# Patient Record
Sex: Female | Born: 2000 | Race: Black or African American | Hispanic: Yes | Marital: Single | Smoking: Never smoker
Health system: Southern US, Community
[De-identification: ages and names within clinical notes are randomized; demographics above are authoritative.]

## PROBLEM LIST (undated history)

## (undated) DIAGNOSIS — K429 Umbilical hernia without obstruction or gangrene: Secondary | ICD-10-CM

## (undated) DIAGNOSIS — Z789 Other specified health status: Secondary | ICD-10-CM

## (undated) HISTORY — PX: ADENOIDECTOMY: SUR15

---
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
## 2020-05-21 ENCOUNTER — Emergency Department: Payer: PRIVATE HEALTH INSURANCE

## 2020-05-21 ENCOUNTER — Other Ambulatory Visit: Payer: Self-pay

## 2020-05-21 ENCOUNTER — Emergency Department
Admission: EM | Admit: 2020-05-21 | Discharge: 2020-05-21 | Disposition: A | Discharge status: Home / Self Care | Payer: PRIVATE HEALTH INSURANCE | Attending: Emergency Medicine | Admitting: Emergency Medicine

## 2020-05-21 ENCOUNTER — Encounter: Payer: Self-pay | Admitting: Radiology

## 2020-05-21 DIAGNOSIS — R519 Headache, unspecified: Secondary | ICD-10-CM | POA: Diagnosis not present

## 2020-05-21 DIAGNOSIS — Y92410 Unspecified street and highway as the place of occurrence of the external cause: Secondary | ICD-10-CM | POA: Diagnosis not present

## 2020-05-21 DIAGNOSIS — R079 Chest pain, unspecified: Secondary | ICD-10-CM | POA: Insufficient documentation
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
## 2020-06-11 ENCOUNTER — Other Ambulatory Visit: Payer: Self-pay

## 2020-06-11 ENCOUNTER — Other Ambulatory Visit (HOSPITAL_COMMUNITY)
Admission: RE | Admit: 2020-06-11 | Discharge: 2020-06-11 | Disposition: A | Discharge status: Home / Self Care | Payer: PRIVATE HEALTH INSURANCE | Source: Ambulatory Visit | Attending: Certified Nurse Midwife | Admitting: Certified Nurse Midwife

## 2020-06-11 ENCOUNTER — Ambulatory Visit (INDEPENDENT_AMBULATORY_CARE_PROVIDER_SITE_OTHER): Payer: No Typology Code available for payment source | Admitting: Certified Nurse Midwife

## 2020-06-11 ENCOUNTER — Encounter: Payer: Self-pay | Admitting: Certified Nurse Midwife

## 2020-06-11 VITALS — BP 104/72 | HR 86 | Ht 63.0 in | Wt 211.1 lb

## 2020-06-11 DIAGNOSIS — Z113 Encounter for screening for infections with a predominantly sexual mode of transmission: Secondary | ICD-10-CM

## 2020-06-11 DIAGNOSIS — R350 Frequency of micturition: Secondary | ICD-10-CM

## 2020-06-11 DIAGNOSIS — R300 Dysuria: Secondary | ICD-10-CM

## 2020-06-11 NOTE — Patient Instructions (Signed)
Urinary Tract Infection, Adult A urinary tract infection (UTI) is an infection of any part of the urinary tract. The urinary tract includes:  The kidneys.  The ureters.  The bladder.  The urethra. These organs make, store, and get rid of pee (urine) in the body. What are the causes? This is caused by germs (bacteria) in your genital area. These germs grow and cause swelling (inflammation) of your urinary tract. What increases the risk? You are more likely to develop this condition if:  You have a small, thin tube (catheter) to drain pee.  You cannot control when you pee or poop (incontinence).  You are female, and: ? You use these methods to prevent pregnancy:  A medicine that kills sperm (spermicide).  A device that blocks sperm (diaphragm). ? You have low levels of a female hormone (estrogen). ? You are pregnant.  You have genes that add to your risk.  You are sexually active.  You take antibiotic medicines.  You have trouble peeing because of: ? A prostate that is bigger than normal, if you are female. ? A blockage in the part of your body that drains pee from the bladder (urethra). ? A kidney stone. ? A nerve condition that affects your bladder (neurogenic bladder). ? Not getting enough to drink. ? Not peeing often enough.  You have other conditions, such as: ? Diabetes. ? A weak disease-fighting system (immune system). ? Sickle cell disease. ? Gout. ? Injury of the spine. What are the signs or symptoms? Symptoms of this condition include:  Needing to pee right away (urgently).  Peeing often.  Peeing small amounts often.  Pain or burning when peeing.  Blood in the pee.  Pee that smells bad or not like normal.  Trouble peeing.  Pee that is cloudy.  Fluid coming from the vagina, if you are female.  Pain in the belly or lower back. Other symptoms include:  Throwing up (vomiting).  No urge to eat.  Feeling mixed up (confused).  Being tired  and grouchy (irritable).  A fever.  Watery poop (diarrhea). How is this treated? This condition may be treated with:  Antibiotic medicine.  Other medicines.  Drinking enough water. Follow these instructions at home:  Medicines  Take over-the-counter and prescription medicines only as told by your doctor.  If you were prescribed an antibiotic medicine, take it as told by your doctor. Do not stop taking it even if you start to feel better. General instructions  Make sure you: ? Pee until your bladder is empty. ? Do not hold pee for a long time. ? Empty your bladder after sex. ? Wipe from front to back after pooping if you are a female. Use each tissue one time when you wipe.  Drink enough fluid to keep your pee pale yellow.  Keep all follow-up visits as told by your doctor. This is important. Contact a doctor if:  You do not get better after 1-2 days.  Your symptoms go away and then come back. Get help right away if:  You have very bad back pain.  You have very bad pain in your lower belly.  You have a fever.  You are sick to your stomach (nauseous).  You are throwing up. Summary  A urinary tract infection (UTI) is an infection of any part of the urinary tract.  This condition is caused by germs in your genital area.  There are many risk factors for a UTI. These include having a small, thin   tube to drain pee and not being able to control when you pee or poop.  Treatment includes antibiotic medicines for germs.  Drink enough fluid to keep your pee pale yellow. This information is not intended to replace advice given to you by your health care provider. Make sure you discuss any questions you have with your health care provider. Document Revised: 06/24/2018 Document Reviewed: 01/14/2018 Elsevier Patient Education  2020 Elsevier Inc.  

## 2020-06-11 NOTE — Progress Notes (Signed)
GYN ENCOUNTER NOTE  Subjective:       Jessica Keith is a 19 y.o. G0P0000 female is here for gynecologic evaluation of the following issues:  1. Screening for STD, she is currently sexually active and sometimes uses condoms 2. A week or so ago she has some UTI symptoms, frequency and burning that have started to improve with hydration and home remedies. She would like to be screened today for STD/vaginal infections.    Gynecologic History No LMP recorded. Patient has had an implant. Contraception: Nexplanon Last Pap: n/a .  Last mammogram: n/a   Obstetric History OB History  Gravida Para Term Preterm AB Living  0 0 0 0 0 0  SAB TAB Ectopic Multiple Live Births  0 0 0 0 0    No past medical history on file.  Past Surgical History:  Procedure Laterality Date  . ADENOIDECTOMY      Current Outpatient Medications on File Prior to Visit  Medication Sig Dispense Refill  . Etonogestrel (NEXPLANON [place]) Inject into the skin.     No current facility-administered medications on file prior to visit.    No Known Allergies  Social History   Socioeconomic History  . Marital status: Single    Spouse name: Not on file  . Number of children: Not on file  . Years of education: Not on file  . Highest education level: Not on file  Occupational History  . Not on file  Tobacco Use  . Smoking status: Never Smoker  . Smokeless tobacco: Never Used  Vaping Use  . Vaping Use: Never used  Substance and Sexual Activity  . Alcohol use: No  . Drug use: No  . Sexual activity: Yes    Partners: Male    Birth control/protection: Implant  Other Topics Concern  . Not on file  Social History Narrative  . Not on file   Social Determinants of Health   Financial Resource Strain:   . Difficulty of Paying Living Expenses: Not on file  Food Insecurity:   . Worried About Charity fundraiser in the Last Year: Not on file  . Ran Out of Food in the Last Year: Not on file  Transportation Needs:    . Lack of Transportation (Medical): Not on file  . Lack of Transportation (Non-Medical): Not on file  Physical Activity:   . Days of Exercise per Week: Not on file  . Minutes of Exercise per Session: Not on file  Stress:   . Feeling of Stress : Not on file  Social Connections:   . Frequency of Communication with Friends and Family: Not on file  . Frequency of Social Gatherings with Friends and Family: Not on file  . Attends Religious Services: Not on file  . Active Member of Clubs or Organizations: Not on file  . Attends Archivist Meetings: Not on file  . Marital Status: Not on file  Intimate Partner Violence:   . Fear of Current or Ex-Partner: Not on file  . Emotionally Abused: Not on file  . Physically Abused: Not on file  . Sexually Abused: Not on file    Family History  Problem Relation Age of Onset  . Hypertension Mother   . Cancer Maternal Grandfather        prostate    The following portions of the patient's history were reviewed and updated as appropriate: allergies, current medications, past family history, past medical history, past social history, past surgical history and problem list.  Review of Systems Review of Systems - Negative except as mentioned in hpi Review of Systems - General ROS: negative for - chills, fatigue, fever, hot flashes, malaise or night sweats Hematological and Lymphatic ROS: negative for - bleeding problems or swollen lymph nodes Gastrointestinal ROS: negative for - abdominal pain, blood in stools, change in bowel habits and nausea/vomiting Musculoskeletal ROS: negative for - joint pain, muscle pain or muscular weakness Genito-Urinary ROS: negative for - change in menstrual cycle, dysmenorrhea, dyspareunia,  genital ulcers, hematuria, incontinence, irregular/heavy menses, nocturia or pelvic pain. Positive-dysuria, genital discharge, itching   Objective:   BP 104/72   Pulse 86   Ht 5\' 3"  (1.6 m)   Wt 211 lb 2 oz (95.8 kg)    BMI 37.40 kg/m  CONSTITUTIONAL: Well-developed, well-nourished female in no acute distress.  HENT:  Normocephalic, atraumatic.  NECK: Normal range of motion, supple, no masses.  Normal thyroid.  SKIN: Skin is warm and dry. No rash noted. Not diaphoretic. No erythema. No pallor. [place]: Alert and oriented to person, place, and time.  PSYCHIATRIC: Normal mood and affect. Normal behavior. Normal judgment and thought content. CARDIOVASCULAR:Not Examined RESPIRATORY: Not Examined BREASTS: Not Examined ABDOMEN: Soft, non distended; Non tender.  No Organomegaly. PELVIC:  External Genitalia: Normal  BUS: Normal  Vagina: Normal  Cervix: Normal, yellow , pink discharge , no odor  MUSCULOSKELETAL: Normal range of motion. No tenderness.  No cyanosis, clubbing, or edema.     Assessment:  Dysuria Urinary frequency Screening STD    Plan:   Vaginal swab collected , will follow up with results. She decline blood work. Urine culture sent. Follow prn .   Philip Aspen, CNM

## 2020-06-14 LAB — URINE CULTURE

## 2020-06-15 ENCOUNTER — Other Ambulatory Visit: Payer: Self-pay | Admitting: Certified Nurse Midwife

## 2020-06-15 LAB — CERVICOVAGINAL ANCILLARY ONLY
Bacterial Vaginitis (gardnerella): POSITIVE — AB
Candida Glabrata: NEGATIVE
Candida Vaginitis: NEGATIVE
Chlamydia: NEGATIVE
Comment: NEGATIVE
Comment: NEGATIVE
Comment: NEGATIVE
Comment: NEGATIVE
Comment: NEGATIVE
Comment: NORMAL
Neisseria Gonorrhea: NEGATIVE
Trichomonas: NEGATIVE

## 2020-06-15 MED ORDER — METRONIDAZOLE 500 MG PO TABS: 500.0000 mg | ORAL_TABLET | Freq: Two times a day (BID) | ORAL | 0 refills | Status: AC

## 2020-08-22 ENCOUNTER — Encounter: Payer: Self-pay | Admitting: Certified Nurse Midwife

## 2020-08-22 ENCOUNTER — Telehealth: Payer: Self-pay

## 2020-08-22 ENCOUNTER — Other Ambulatory Visit: Payer: Self-pay

## 2020-08-22 ENCOUNTER — Ambulatory Visit (INDEPENDENT_AMBULATORY_CARE_PROVIDER_SITE_OTHER): Payer: PRIVATE HEALTH INSURANCE | Admitting: Certified Nurse Midwife

## 2020-08-22 VITALS — BP 113/79 | HR 92 | Ht 63.0 in | Wt 216.1 lb

## 2020-08-22 DIAGNOSIS — N6452 Nipple discharge: Secondary | ICD-10-CM | POA: Diagnosis not present

## 2020-08-22 NOTE — Progress Notes (Signed)
GYN ENCOUNTER NOTE  Subjective:       Jessica Keith is a 20 y.o. G0P0000 female is here for gynecologic evaluation of the following issues:  1. Clear nipple discharge more from left breast x 1 wk. Stats that initially she just had some leaking but has since been stimulating ( squeezing the nipple) to evaluate if she still has the discharge. She denies trama, redness , fever.    Gynecologic History No LMP recorded. Patient has had an implant. Contraception: Nexplanon Last Pap:n/a .  Last mammogram: not indicated  Obstetric History OB History  Gravida Para Term Preterm AB Living  0 0 0 0 0 0  SAB IAB Ectopic Multiple Live Births  0 0 0 0 0    No past medical history on file.  Past Surgical History:  Procedure Laterality Date  . ADENOIDECTOMY      Current Outpatient Medications on File Prior to Visit  Medication Sig Dispense Refill  . Etonogestrel (NEXPLANON [place]) Inject into the skin.     No current facility-administered medications on file prior to visit.    No Known Allergies  Social History   Socioeconomic History  . Marital status: Single    Spouse name: Not on file  . Number of children: Not on file  . Years of education: Not on file  . Highest education level: Not on file  Occupational History  . Not on file  Tobacco Use  . Smoking status: Never Smoker  . Smokeless tobacco: Never Used  Vaping Use  . Vaping Use: Never used  Substance and Sexual Activity  . Alcohol use: No  . Drug use: No  . Sexual activity: Yes    Partners: Male    Birth control/protection: Implant  Other Topics Concern  . Not on file  Social History Narrative  . Not on file   Social Determinants of Health   Financial Resource Strain: Not on file  Food Insecurity: Not on file  Transportation Needs: Not on file  Physical Activity: Not on file  Stress: Not on file  Social Connections: Not on file  Intimate Partner Violence: Not on file    Family History  Problem Relation Age  of Onset  . Hypertension Mother   . Cancer Maternal Grandfather        prostate    The following portions of the patient's history were reviewed and updated as appropriate: allergies, current medications, past family history, past medical history, past social history, past surgical history and problem list.  Review of Systems Review of Systems - Negative except as mentioned in hpi Review of Systems - General ROS: negative for - chills, fatigue, fever, hot flashes, malaise or night sweats Hematological and Lymphatic ROS: negative for - bleeding problems or swollen lymph nodes Gastrointestinal ROS: negative for - abdominal pain, blood in stools, change in bowel habits and nausea/vomiting Musculoskeletal ROS: negative for - joint pain, muscle pain or muscular weakness Genito-Urinary ROS: negative for - change in menstrual cycle, dysmenorrhea, dyspareunia, dysuria, genital discharge, genital ulcers, hematuria, incontinence, irregular/heavy menses, nocturia or pelvic pain  Objective:   BP 113/79   Pulse 92   Ht 5\' 3"  (1.6 m)   Wt 216 lb 1 oz (98 kg)   BMI 38.27 kg/m  CONSTITUTIONAL: Well-developed, well-nourished female in no acute distress.  HENT:  Normocephalic, atraumatic.  NECK: Normal range of motion, supple, no masses.  Normal thyroid.  SKIN: Skin is warm and dry. No rash noted. Not diaphoretic. No erythema. No  pallor. [place]: Alert and oriented to person, place, and time. PSYCHIATRIC: Normal mood and affect. Normal behavior. Normal judgment and thought content. CARDIOVASCULAR:Not Examined RESPIRATORY: Not Examined BREASTS:Breasts: breasts appear normal, no suspicious masses, no skin or nipple changes or axillary nodes. Large pendulous breast no masses present. Small beads of clear discharge of left breast with virous squeezing.  ABDOMEN: Soft, non distended; Non tender.  No Organomegaly. PELVIC:n/a  MUSCULOSKELETAL: Normal range of motion. No tenderness.  No cyanosis, clubbing,  or edema.     Assessment:   1. Nipple discharge - Prolactin     Plan:   Reassurance given; Discussed cause of nipple discharge , information fact sheet given. Prolactin level and culture ordered. Will follow up with results.   Philip Aspen, CNM

## 2020-08-22 NOTE — Telephone Encounter (Signed)
mychart message sent

## 2020-08-23 LAB — PROLACTIN: Prolactin: 13.4 ng/mL (ref 4.8–23.3)

## 2020-08-29 LAB — ANAEROBIC AND AEROBIC CULTURE

## 2020-09-17 ENCOUNTER — Ambulatory Visit (INDEPENDENT_AMBULATORY_CARE_PROVIDER_SITE_OTHER): Payer: PRIVATE HEALTH INSURANCE | Admitting: Certified Nurse Midwife

## 2020-09-17 ENCOUNTER — Other Ambulatory Visit: Payer: Self-pay

## 2020-09-17 VITALS — BP 115/62 | HR 68 | Wt 217.6 lb

## 2020-09-17 DIAGNOSIS — Z3046 Encounter for surveillance of implantable subdermal contraceptive: Secondary | ICD-10-CM | POA: Diagnosis not present

## 2020-09-17 DIAGNOSIS — E669 Obesity, unspecified: Secondary | ICD-10-CM | POA: Insufficient documentation

## 2020-09-17 NOTE — Progress Notes (Signed)
Jessica Keith is a 20 y.o. year old G46P0000 female here for Nexplanon removal.  She was given informed consent for removal  of her Nexplanon. Her Nexplanon was placed last year. She states it has caused significant mood changes and she has tried to wait it out but It has not improved. , No LMP recorded. Patient has had an implant.,   BP 115/62   Pulse 68   Wt 217 lb 9.6 oz (98.7 kg)   BMI 38.55 kg/m  No LMP recorded. Patient has had an implant. No results found for this or any previous visit (from the past 24 hour(s)).   Appropriate time out taken. Nexplanon site identified.  Area prepped in usual sterile fashon. Two cc's of 2% lidocaine was used to anesthetize the area. A small stab incision was made right beside the implant on the distal portion.  The Nexplanon rod was grasped using hemostats and removed intact without difficulty.  Steri-strips and a pressure bandage was applied.  There was less than 3 cc blood loss. There were no complications.  The patient tolerated the procedure well.  She was instructed to keep the area clean and dry, remove pressure bandage in 24 hours, and keep insertion site covered with the steri-strips for 3-5 days.  She declines hormonal method , discussed para guard she declines at this time state she will use condoms. Follow-up PRN problems.  Philip Aspen, CNM

## 2020-09-17 NOTE — Patient Instructions (Signed)

## 2020-12-06 ENCOUNTER — Encounter: Payer: Self-pay | Admitting: Emergency Medicine

## 2020-12-06 ENCOUNTER — Emergency Department
Admission: EM | Admit: 2020-12-06 | Discharge: 2020-12-06 | Disposition: A | Discharge status: Home / Self Care | Payer: PRIVATE HEALTH INSURANCE | Attending: Emergency Medicine | Admitting: Emergency Medicine

## 2020-12-06 ENCOUNTER — Other Ambulatory Visit: Payer: Self-pay

## 2020-12-06 DIAGNOSIS — R197 Diarrhea, unspecified: Secondary | ICD-10-CM | POA: Diagnosis not present

## 2020-12-06 DIAGNOSIS — R112 Nausea with vomiting, unspecified: Secondary | ICD-10-CM | POA: Diagnosis not present

## 2020-12-06 DIAGNOSIS — R1013 Epigastric pain: Secondary | ICD-10-CM | POA: Insufficient documentation

## 2020-12-06 LAB — URINALYSIS, COMPLETE (UACMP) WITH MICROSCOPIC
Bilirubin Urine: NEGATIVE
Glucose, UA: NEGATIVE mg/dL
Ketones, ur: 20 mg/dL — AB
Leukocytes,Ua: NEGATIVE
Nitrite: NEGATIVE
Protein, ur: 30 mg/dL — AB
RBC / HPF: >50 RBC/hpf — ABNORMAL HIGH (ref 0–5)
Specific Gravity, Urine: 1.034 — ABNORMAL HIGH (ref 1.005–1.030)
pH: 5 (ref 5.0–8.0)

## 2020-12-06 LAB — CBC WITH DIFFERENTIAL/PLATELET
Abs Immature Granulocytes: 0.03 10*3/uL (ref 0.00–0.07)
Basophils Absolute: 0 10*3/uL (ref 0.0–0.1)
Basophils Relative: 0 %
Eosinophils Absolute: 0.2 10*3/uL (ref 0.0–0.5)
Eosinophils Relative: 2 %
HCT: 42.9 % (ref 36.0–46.0)
Hemoglobin: 15 g/dL (ref 12.0–15.0)
Immature Granulocytes: 0 %
Lymphocytes Relative: 26 %
Lymphs Abs: 2.1 10*3/uL (ref 0.7–4.0)
MCH: 29.7 pg (ref 26.0–34.0)
MCHC: 35 g/dL (ref 30.0–36.0)
MCV: 85 fL (ref 80.0–100.0)
Monocytes Absolute: 0.7 10*3/uL (ref 0.1–1.0)
Monocytes Relative: 9 %
Neutro Abs: 4.8 10*3/uL (ref 1.7–7.7)
Neutrophils Relative %: 63 %
Platelets: 251 10*3/uL (ref 150–400)
RBC: 5.05 MIL/uL (ref 3.87–5.11)
RDW: 13 % (ref 11.5–15.5)
WBC: 7.9 10*3/uL (ref 4.0–10.5)
nRBC: 0 % (ref 0.0–0.2)

## 2020-12-06 LAB — COMPREHENSIVE METABOLIC PANEL
ALT: 35 U/L (ref 0–44)
AST: 26 U/L (ref 15–41)
Albumin: 4.1 g/dL (ref 3.5–5.0)
Alkaline Phosphatase: 91 U/L (ref 38–126)
Anion gap: 9 (ref 5–15)
BUN: 12 mg/dL (ref 6–20)
CO2: 19 mmol/L — ABNORMAL LOW (ref 22–32)
Calcium: 8.8 mg/dL — ABNORMAL LOW (ref 8.9–10.3)
Chloride: 107 mmol/L (ref 98–111)
Creatinine, Ser: 0.67 mg/dL (ref 0.44–1.00)
GFR, Estimated: >60 mL/min (ref >60)
Glucose, Bld: 93 mg/dL (ref 70–99)
Potassium: 4.1 mmol/L (ref 3.5–5.1)
Sodium: 135 mmol/L (ref 135–145)
Total Bilirubin: 0.7 mg/dL (ref 0.3–1.2)
Total Protein: 8 g/dL (ref 6.5–8.1)

## 2020-12-06 LAB — LIPASE, BLOOD: Lipase: 27 U/L (ref 11–51)

## 2020-12-06 LAB — POC URINE PREG, ED: Preg Test, Ur: NEGATIVE

## 2020-12-06 MED ORDER — ONDANSETRON 4 MG PO TBDP: 4.0000 mg | ORAL_TABLET | Freq: Three times a day (TID) | ORAL | 0 refills | Status: DC | PRN

## 2020-12-06 NOTE — ED Notes (Signed)
Pt to ED c/o midepigastric abdominal pain since about 3-4d ago. Has been eating mostly saltine crackers but yesterday ate hot dog and pain was worse after eating. Also has intermittent pain in lower abdomen. Describes pain as dull. Has NVD since several days. Denies pain, burning with urination. LNMP was 11/11/20. Mother at bedside. Pt in NAD, ambulated to ED room with steady gait.

## 2020-12-06 NOTE — ED Provider Notes (Signed)
[facility] Emergency Department Provider Note   ____________________________________________   Event Date/Time   First MD Initiated Contact with Patient 12/06/20 [phone]     (approximate)  I have reviewed the triage vital signs and the nursing notes.   HISTORY  Chief Complaint Abdominal Pain    HPI Jessica Keith is a 20 y.o. female with no stated past medical history who presents for 3 days of nausea/vomiting/diarrhea with accompanying midepigastric abdominal pain.  Patient states that she initially began with nausea 3 days ago evolved into midepigastric abdominal pain with associated nonbloody, nonbilious emesis.  Later that day she began having watery nonbloody diarrhea as well.  Patient states that symptoms have started to resolve at this point but she still feels a dull, aching, midepigastric abdominal pain that radiates down to her umbilicus.  Denies any exacerbating or relieving factors for this pain.  Denies any further diarrhea at this time.  Patient states she is intermittently able to tolerate p.o. depending on what it is.  Patient currently denies any vision changes, tinnitus, difficulty speaking, facial droop, sore throat, chest pain, shortness of breath, dysuria, or weakness/numbness/paresthesias in any extremity         History reviewed. No pertinent past medical history.  Patient Active Problem List   Diagnosis Date Noted  . Obesity (BMI 30-39.9) 09/17/2020  . NEVUS, BENIGN 03/01/2010    Past Surgical History:  Procedure Laterality Date  . ADENOIDECTOMY      Prior to Admission medications   Medication Sig Start Date End Date Taking? Authorizing Provider  ondansetron (ZOFRAN ODT) 4 MG disintegrating tablet Take 1 tablet (4 mg total) by mouth every 8 (eight) hours as needed for nausea or vomiting. 12/06/20  Yes Kameka Whan, Vista Lawman, MD    Allergies Patient has no known allergies.  Family History  Problem Relation Age of Onset  .  Hypertension Mother   . Cancer Maternal Grandfather        prostate    Social History Social History   Tobacco Use  . Smoking status: Never Smoker  . Smokeless tobacco: Never Used  Vaping Use  . Vaping Use: Never used  Substance Use Topics  . Alcohol use: No  . Drug use: No    Review of Systems Constitutional: No fever/chills Eyes: No visual changes. ENT: No sore throat. Cardiovascular: Denies chest pain. Respiratory: Denies shortness of breath. Gastrointestinal: Endorses midepigastric abdominal pain, nausea, vomiting, and diarrhea Genitourinary: Negative for dysuria. Musculoskeletal: Negative for acute arthralgias Skin: Negative for rash. Neurological: Negative for headaches, weakness/numbness/paresthesias in any extremity Psychiatric: Negative for suicidal ideation/homicidal ideation   ____________________________________________   PHYSICAL EXAM:  VITAL SIGNS: ED Triage Vitals [12/06/20 0647]  Enc Vitals Group     BP 124/78     Pulse Rate 88     Resp 18     Temp 98.3 F (36.8 C)     Temp Source Oral     SpO2 97 %     Weight      Height 5\' 3"  (1.6 m)     Head Circumference      Peak Flow      Pain Score 8     Pain Loc      Pain Edu?      Excl. in [place]?    Constitutional: Alert and oriented. Well appearing and in no acute distress. Eyes: Conjunctivae are normal. PERRL. Head: Atraumatic. Nose: No congestion/rhinnorhea. Mouth/Throat: Mucous membranes are moist. Neck: No stridor Cardiovascular: Grossly normal  heart sounds.  Good peripheral circulation. Respiratory: Normal respiratory effort.  No retractions. Gastrointestinal: Soft and mild midepigastric tenderness to palpation. No distention. Musculoskeletal: No obvious deformities Neurologic:  Normal speech and language. No gross focal neurologic deficits are appreciated. Skin:  Skin is warm and dry. No rash noted. Psychiatric: Mood and affect are normal. Speech and behavior are  normal.  ____________________________________________   LABS (all labs ordered are listed, but only abnormal results are displayed)  Labs Reviewed  COMPREHENSIVE METABOLIC PANEL - Abnormal; Notable for the following components:      Result Value   CO2 19 (*)    Calcium 8.8 (*)    All other components within normal limits  CBC WITH DIFFERENTIAL/PLATELET  LIPASE, BLOOD  URINALYSIS, COMPLETE (UACMP) WITH MICROSCOPIC   ____________________________________________  PROCEDURES  Procedure(s) performed (including Critical Care):  .1-3 Lead EKG Interpretation Performed by: Naaman Plummer, MD Authorized by: Naaman Plummer, MD     Interpretation: normal     ECG rate:  87   ECG rate assessment: normal     Rhythm: sinus rhythm     Ectopy: none     Conduction: normal       ____________________________________________   INITIAL IMPRESSION / ASSESSMENT AND PLAN / ED COURSE  As part of my medical decision making, I reviewed the following data within the [place] notes reviewed and incorporated, Labs reviewed, EKG interpreted, Old chart reviewed, Radiograph reviewed and Notes from prior ED visits reviewed and incorporated        Patient presents for acute nausea/vomiting The cause of the patients symptoms is not clear, but the patient is overall well appearing and is suspected to have a transient course of illness.  Given History and Exam there does not appear to be an emergent cause of the symptoms such as small bowel obstruction, coronary syndrome, bowel ischemia, DKA, pancreatitis, appendicitis, other acute abdomen or other emergent problem.  Reassessment: After treatment, the patient is feeling much better, tolerating PO fluids, and shows no signs of dehydration.   Disposition: Discharge home with prompt primary care physician follow up in the next 48 hours. Strict return precautions discussed.       ____________________________________________   FINAL CLINICAL IMPRESSION(S) / ED DIAGNOSES  Final diagnoses:  Non-intractable vomiting with nausea, unspecified vomiting type  Epigastric pain     ED Discharge Orders         Ordered    ondansetron (ZOFRAN ODT) 4 MG disintegrating tablet  Every 8 hours PRN        12/06/20 0814           Note:  This document was prepared using Dragon voice recognition software and may include unintentional dictation errors.   Naaman Plummer, MD 12/06/20 [phone]

## 2020-12-06 NOTE — ED Triage Notes (Signed)
Patient ambulatory to triage with steady gait, without difficulty or distress noted; pt reports since Tuesday having mid lower abd pain accomp by N/V

## 2021-05-07 ENCOUNTER — Encounter: Payer: PRIVATE HEALTH INSURANCE | Admitting: Certified Nurse Midwife

## 2021-05-13 ENCOUNTER — Encounter: Payer: Self-pay | Admitting: Certified Nurse Midwife

## 2021-05-13 ENCOUNTER — Ambulatory Visit (INDEPENDENT_AMBULATORY_CARE_PROVIDER_SITE_OTHER): Payer: PRIVATE HEALTH INSURANCE | Admitting: Certified Nurse Midwife

## 2021-05-13 ENCOUNTER — Other Ambulatory Visit: Payer: Self-pay

## 2021-05-13 VITALS — BP 119/84 | HR 88 | Ht 63.0 in | Wt 220.7 lb

## 2021-05-13 DIAGNOSIS — Z3200 Encounter for pregnancy test, result unknown: Secondary | ICD-10-CM | POA: Diagnosis not present

## 2021-05-13 LAB — POCT URINE PREGNANCY: Preg Test, Ur: POSITIVE — AB

## 2021-05-13 MED ORDER — DOXYLAMINE-PYRIDOXINE 10-10 MG PO TBEC: 1.0000 | DELAYED_RELEASE_TABLET | Freq: Four times a day (QID) | ORAL | 5 refills | Status: DC

## 2021-05-13 NOTE — Patient Instructions (Signed)
Prenatal Care ?Prenatal care is health care during pregnancy. It helps you and your unborn baby (fetus) stay as healthy as possible. Prenatal care may be provided by a midwife, a family practice doctor, a mid-level practitioner (nurse practitioner or physician assistant), or a childbirth and pregnancy doctor (obstetrician). ?How does this affect me? ?During pregnancy, you will be closely monitored for any new conditions that might develop. To lower your risk of pregnancy complications, you and your health care provider will talk about any underlying conditions you have. ?How does this affect my baby? ?Early and consistent prenatal care increases the chance that your baby will be healthy during pregnancy. Prenatal care lowers the risk that your baby will be: ?Born early (prematurely). ?Smaller than expected at birth (small for gestational age). ?What can I expect at the first prenatal care visit? ?Your first prenatal care visit will likely be the longest. You should schedule your first prenatal care visit as soon as you know that you are pregnant. Your first visit is a good time to talk about any questions or concerns you have about pregnancy. ?Medical history ?At your visit, you and your health care provider will talk about your medical history, including: ?Any past pregnancies. ?Your family's medical history. ?Medical history of the baby's father. ?Any long-term (chronic) health conditions you have and how you manage them. ?Any surgeries or procedures you have had. ?Any current over-the-counter or prescription medicines, herbs, or supplements that you are taking. ?Other factors that could pose a risk to your baby, including: ?Exposure to harmful chemicals or radiation at work or at home. ?Any substance use, including tobacco, alcohol, and drug use. ?Your home setting and your stress levels, including: ?Exposure to abuse or violence. ?Household financial strain. ?Your daily health habits, including diet and  exercise. ?Tests and screenings ?Your health care provider will: ?Measure your weight, height, and blood pressure. ?Do a physical exam, including a pelvic and breast exam. ?Perform blood tests and urine tests to check for: ?Urinary tract infection. ?Sexually transmitted infections (STIs). ?Low iron levels in your blood (anemia). ?Blood type and certain proteins on red blood cells (Rh antibodies). ?Infections and immunity to viruses, such as hepatitis B and rubella. ?HIV (human immunodeficiency virus). ?Discuss your options for genetic screening. ?Tips about staying healthy ?Your health care provider will also give you information about how to keep yourself and your baby healthy, including: ?Nutrition and taking vitamins. ?Physical activity. ?How to manage pregnancy symptoms such as nausea and vomiting (morning sickness). ?Infections and substances that may be harmful to your baby and how to avoid them. ?Food safety. ?Dental care. ?Working. ?Travel. ?Warning signs to watch for and when to call your health care provider. ?How often will I have prenatal care visits? ?After your first prenatal care visit, you will have regular visits throughout your pregnancy. The visit schedule is often as follows: ?Up to week 28 of pregnancy: once every 4 weeks. ?28-36 weeks: once every 2 weeks. ?After 36 weeks: every week until delivery. ?Some women may have visits more or less often depending on any underlying health conditions and the health of the baby. ?Keep all follow-up and prenatal care visits. This is important. ?What happens during routine prenatal care visits? ?Your health care provider will: ?Measure your weight and blood pressure. ?Check for fetal heart sounds. ?Measure the height of your uterus in your abdomen (fundal height). This may be measured starting around week 20 of pregnancy. ?Check the position of your baby inside your uterus. ?Ask questions   about your diet, sleeping patterns, and whether you can feel the baby  move. ?Review warning signs to watch for and signs of labor. ?Ask about any pregnancy symptoms you are having and how you are dealing with them. Symptoms may include: ?Headaches. ?Nausea and vomiting. ?Vaginal discharge. ?Swelling. ?Fatigue. ?Constipation. ?Changes in your vision. ?Feeling persistently sad or anxious. ?Any discomfort, including back or pelvic pain. ?Bleeding or spotting. ?Make a list of questions to ask your health care provider at your routine visits. ?What tests might I have during prenatal care visits? ?You may have blood, urine, and imaging tests throughout your pregnancy, such as: ?Urine tests to check for glucose, protein, or signs of infection. ?Glucose tests to check for a form of diabetes that can develop during pregnancy (gestational diabetes mellitus). This is usually done around week 24 of pregnancy. ?Ultrasounds to check your baby's growth and development, to check for birth defects, and to check your baby's well-being. These can also help to decide when you should deliver your baby. ?A test to check for group B strep (GBS) infection. This is usually done around week 36 of pregnancy. ?Genetic testing. This may include blood, fluid, or tissue sampling, or imaging tests, such as an ultrasound. Some genetic tests are done during the first trimester and some are done during the second trimester. ?What else can I expect during prenatal care visits? ?Your health care provider may recommend getting certain vaccines during pregnancy. These may include: ?A yearly flu shot (annual influenza vaccine). This is especially important if you will be pregnant during flu season. ?Tdap (tetanus, diphtheria, pertussis) vaccine. Getting this vaccine during pregnancy can protect your baby from whooping cough (pertussis) after birth. This vaccine may be recommended between weeks 27 and 36 of pregnancy. ?A COVID-19 vaccine. ?Later in your pregnancy, your health care provider may give you information  about: ?Childbirth and breastfeeding classes. ?Choosing a health care provider for your baby. ?Umbilical cord banking. ?Breastfeeding. ?Birth control after your baby is born. ?The hospital labor and delivery unit and how to set up a tour. ?Registering at the hospital before you go into labor. ?Where to find more information ?Office on Women's Health: womenshealth.gov ?American Pregnancy Association: americanpregnancy.org ?March of Dimes: marchofdimes.org ?Summary ?Prenatal care helps you and your baby stay as healthy as possible during pregnancy. ?Your first prenatal care visit will most likely be the longest. ?You will have visits and tests throughout your pregnancy to monitor your health and your baby's health. ?Bring a list of questions to your visits to ask your health care provider. ?Make sure to keep all follow-up and prenatal care visits. ?This information is not intended to replace advice given to you by your health care provider. Make sure you discuss any questions you have with your health care provider. ?Document Revised: 04/19/2020 Document Reviewed: 04/19/2020 ?Elsevier Patient Education ? 2022 Elsevier Inc. ? ?

## 2021-05-13 NOTE — Progress Notes (Signed)
GYN ENCOUNTER NOTE  Subjective:       Kaari Zeigler is a 20 y.o. G69P0000 female is here for gynecologic evaluation of the following issues:  1. Breast pain for the past few weeks, states she has history of breast pain in the past as well but that it has worsened. .     Gynecologic History Patient's last menstrual period was 04/08/2021. Contraception: none Last Pap: n/a.  Last mammogram: n/a .  Obstetric History OB History  Gravida Para Term Preterm AB Living  1 0 0 0 0 0  SAB IAB Ectopic Multiple Live Births  0 0 0 0 0    # Outcome Date GA Lbr Len/2nd Weight Sex Delivery Anes PTL Lv  1 Current             History reviewed. No pertinent past medical history.  Past Surgical History:  Procedure Laterality Date   ADENOIDECTOMY      Current Outpatient Medications on File Prior to Visit  Medication Sig Dispense Refill   ondansetron (ZOFRAN ODT) 4 MG disintegrating tablet Take 1 tablet (4 mg total) by mouth every 8 (eight) hours as needed for nausea or vomiting. 20 tablet 0   No current facility-administered medications on file prior to visit.    No Known Allergies  Social History   Socioeconomic History   Marital status: Single    Spouse name: Not on file   Number of children: Not on file   Years of education: Not on file   Highest education level: Not on file  Occupational History   Not on file  Tobacco Use   Smoking status: Never   Smokeless tobacco: Never  Vaping Use   Vaping Use: Some days  Substance and Sexual Activity   Alcohol use: No   Drug use: No   Sexual activity: Yes    Partners: Male    Birth control/protection: None  Other Topics Concern   Not on file  Social History Narrative   Not on file   Social Determinants of Health   Financial Resource Strain: Not on file  Food Insecurity: Not on file  Transportation Needs: Not on file  Physical Activity: Not on file  Stress: Not on file  Social Connections: Not on file  Intimate Partner  Violence: Not on file    Family History  Problem Relation Age of Onset   Hypertension Mother    Hypertension Father    Cancer Maternal Grandfather        prostate    The following portions of the patient's history were reviewed and updated as appropriate: allergies, current medications, past family history, past medical history, past social history, past surgical history and problem list.  Review of Systems Review of Systems - Negative except as mentioned in HPI Review of Systems - General ROS: negative for - chills, fatigue, fever, hot flashes, malaise or night sweats Hematological and Lymphatic ROS: negative for - bleeding problems or swollen lymph nodes Gastrointestinal ROS: negative for - abdominal pain, blood in stools, change in bowel habits and nausea/vomiting Musculoskeletal ROS: negative for - joint pain, muscle pain or muscular weakness Genito-Urinary ROS: negative for - change in menstrual cycle, dysmenorrhea, dyspareunia, dysuria, genital discharge, genital ulcers, hematuria, incontinence, irregular/heavy menses, nocturia or pelvic pain  Objective:   BP 119/84   Pulse 88   Ht 5\' 3"  (1.6 m)   Wt 220 lb 11.2 oz (100.1 kg)   LMP 04/08/2021   BMI 39.10 kg/m  CONSTITUTIONAL: Well-developed, well-nourished  female in no acute distress.  HENT:  Normocephalic, atraumatic.  NECK: Normal range of motion, supple, no masses.  Normal thyroid.  SKIN: Skin is warm and dry. No rash noted. Not diaphoretic. No erythema. No pallor. [place]: Alert and oriented to person, place, and time. PSYCHIATRIC: Normal mood and affect. Normal behavior. Normal judgment and thought content. CARDIOVASCULAR:Not Examined RESPIRATORY: Not Examined BREASTS: Not Examined ABDOMEN: Soft, non distended; Non tender.  No Organomegaly. PELVIC: not indicated MUSCULOSKELETAL: Normal range of motion. No tenderness.  No cyanosis, clubbing, or edema.     Assessment:   1. Possible pregnancy, not yet  confirmed - POCT urine pregnancy 2. Breast pain     Plan:   Positive UPT today.  Discussed with pt, she state she had a positive test yesterday at home but already had this appointment so she decided to keep it. She plans on keeping pregnancy. Discussed u/s for dating 3 wks,  Nurse visit @ 10 wks, NOB physical with me in 7 wks. Encouraged use of daily PNV, sample given. Pt admits to nausea for the past 2 days and is requesting medication . Orders placed. Follow up as noted.   Philip Aspen, CNM

## 2021-05-28 ENCOUNTER — Emergency Department
Admission: EM | Admit: 2021-05-28 | Discharge: 2021-05-28 | Disposition: A | Discharge status: Home / Self Care | Payer: PRIVATE HEALTH INSURANCE | Attending: Emergency Medicine | Admitting: Emergency Medicine

## 2021-05-28 ENCOUNTER — Other Ambulatory Visit: Payer: Self-pay

## 2021-05-28 ENCOUNTER — Encounter: Payer: Self-pay | Admitting: Emergency Medicine

## 2021-05-28 ENCOUNTER — Emergency Department: Payer: PRIVATE HEALTH INSURANCE

## 2021-05-28 DIAGNOSIS — O208 Other hemorrhage in early pregnancy: Secondary | ICD-10-CM | POA: Insufficient documentation

## 2021-05-28 DIAGNOSIS — IMO0001 Reserved for inherently not codable concepts without codable children: Secondary | ICD-10-CM

## 2021-05-28 DIAGNOSIS — O418X10 Other specified disorders of amniotic fluid and membranes, first trimester, not applicable or unspecified: Secondary | ICD-10-CM

## 2021-05-28 DIAGNOSIS — D72829 Elevated white blood cell count, unspecified: Secondary | ICD-10-CM | POA: Insufficient documentation

## 2021-05-28 DIAGNOSIS — R8271 Bacteriuria: Secondary | ICD-10-CM | POA: Insufficient documentation

## 2021-05-28 DIAGNOSIS — O209 Hemorrhage in early pregnancy, unspecified: Secondary | ICD-10-CM

## 2021-05-28 DIAGNOSIS — O99891 Other specified diseases and conditions complicating pregnancy: Secondary | ICD-10-CM

## 2021-05-28 DIAGNOSIS — Z3A01 Less than 8 weeks gestation of pregnancy: Secondary | ICD-10-CM | POA: Diagnosis not present

## 2021-05-28 DIAGNOSIS — O468X1 Other antepartum hemorrhage, first trimester: Secondary | ICD-10-CM

## 2021-05-28 LAB — URINALYSIS, ROUTINE W REFLEX MICROSCOPIC
Bilirubin Urine: NEGATIVE
Glucose, UA: NEGATIVE mg/dL
Ketones, ur: NEGATIVE mg/dL
Leukocytes,Ua: NEGATIVE
Nitrite: NEGATIVE
Protein, ur: NEGATIVE mg/dL
Specific Gravity, Urine: 1.023 (ref 1.005–1.030)
pH: 7 (ref 5.0–8.0)

## 2021-05-28 LAB — CBC WITH DIFFERENTIAL/PLATELET
Abs Immature Granulocytes: 0.05 10*3/uL (ref 0.00–0.07)
Basophils Absolute: 0.1 10*3/uL (ref 0.0–0.1)
Basophils Relative: 0 %
Eosinophils Absolute: 0.1 10*3/uL (ref 0.0–0.5)
Eosinophils Relative: 1 %
HCT: 41.5 % (ref 36.0–46.0)
Hemoglobin: 13.8 g/dL (ref 12.0–15.0)
Immature Granulocytes: 0 %
Lymphocytes Relative: 20 %
Lymphs Abs: 2.5 10*3/uL (ref 0.7–4.0)
MCH: 29 pg (ref 26.0–34.0)
MCHC: 33.3 g/dL (ref 30.0–36.0)
MCV: 87.2 fL (ref 80.0–100.0)
Monocytes Absolute: 0.7 10*3/uL (ref 0.1–1.0)
Monocytes Relative: 5 %
Neutro Abs: 9.2 10*3/uL — ABNORMAL HIGH (ref 1.7–7.7)
Neutrophils Relative %: 74 %
Platelets: 291 10*3/uL (ref 150–400)
RBC: 4.76 MIL/uL (ref 3.87–5.11)
RDW: 13.1 % (ref 11.5–15.5)
WBC: 12.6 10*3/uL — ABNORMAL HIGH (ref 4.0–10.5)
nRBC: 0 % (ref 0.0–0.2)

## 2021-05-28 LAB — ABO/RH: ABO/RH(D): O POS

## 2021-05-28 LAB — POC URINE PREG, ED: Preg Test, Ur: POSITIVE — AB

## 2021-05-28 LAB — HCG, QUANTITATIVE, PREGNANCY: hCG, Beta Chain, Quant, S: 39216 m[IU]/mL — ABNORMAL HIGH (ref <5)

## 2021-05-28 MED ORDER — CEPHALEXIN 500 MG PO CAPS: 500.0000 mg | ORAL_CAPSULE | Freq: Four times a day (QID) | ORAL | 0 refills | Status: AC

## 2021-05-28 NOTE — ED Triage Notes (Signed)
Pt reports that she is [redacted] weeks pregnant and noticed some bright red blood with some clots when she wiped after urinating. No cramping or any other concerns.

## 2021-05-28 NOTE — ED Triage Notes (Signed)
First RN Note: pt to ED via POV, states is approx  [redacted] weeks pregnant, urinated today and noted blood in her urine. States happened one time only today.

## 2021-05-28 NOTE — Discharge Instructions (Addendum)
Your Ultrasound today showed: 1. Single living intrauterine pregnancy identified measuring at 6 weeks 4 days gestation. 2. Embryonic bradycardia with average heart rate of 94 beats per minute, which can have negative prognostic indications at this gestational age. There is also a small subchorionic hemorrhage.  As we discussed you have some bacteria in her urine as well I will do not think you have significant urinary tract infection I will treat this because you are pregnant with a course of antibiotics.

## 2021-05-28 NOTE — ED Provider Notes (Signed)
[facility] Emergency Department Provider Note  ____________________________________________   Event Date/Time   First MD Initiated Contact with Patient 05/28/21 1026     (approximate)  I have reviewed the triage vital signs and the nursing notes.   HISTORY  Chief Complaint Vaginal Bleeding   HPI Jessica Keith is a 20 y.o. female without significant past medical history G1, P0 who presents for assessment of some blood she noticed when she wiped after urinating this morning.  She states she is approximately [redacted] weeks pregnant by her last LMP and saw midwife but not yet had an ultrasound.  She denies any trauma or injuries, abdominal pain, back pain, other vaginal bleeding or discharge, burning with urination, diarrhea, constipation or injuries.  No fevers, chills, headache or earache, sore throat, dizziness, chest pain, cough or any other acute sick symptoms.  She is only on prenatals.  No other medications.  No illicit drugs.  No other acute concerns at this time.         History reviewed. No pertinent past medical history.  Patient Active Problem List   Diagnosis Date Noted   Obesity (BMI 30-39.9) 09/17/2020   NEVUS, BENIGN 03/01/2010    Past Surgical History:  Procedure Laterality Date   ADENOIDECTOMY      Prior to Admission medications   Medication Sig Start Date End Date Taking? Authorizing Provider  cephALEXin (KEFLEX) 500 MG capsule Take 1 capsule (500 mg total) by mouth 4 (four) times daily for 5 days. 05/28/21 06/02/21 Yes Lucrezia Starch, MD  Doxylamine-Pyridoxine 10-10 MG TBEC Take 1 tablet by mouth 4 (four) times daily. Day 1 &2: 2 tablet at bedtimeDay 3 : if symptoms persists 1 tablet am; 2 tablet at bedtimeDay 4: 1 tablet am, 1 tab afternoon, 2 tab at bedtime 05/13/21   Philip Aspen, CNM  ondansetron (ZOFRAN ODT) 4 MG disintegrating tablet Take 1 tablet (4 mg total) by mouth every 8 (eight) hours as needed for nausea or vomiting.  12/06/20   Naaman Plummer, MD    Allergies Patient has no known allergies.  Family History  Problem Relation Age of Onset   Hypertension Mother    Hypertension Father    Cancer Maternal Grandfather        prostate    Social History Social History   Tobacco Use   Smoking status: Never   Smokeless tobacco: Never  Vaping Use   Vaping Use: Some days  Substance Use Topics   Alcohol use: No   Drug use: No    Review of Systems  Review of Systems  Constitutional:  Negative for chills and fever.  HENT:  Negative for sore throat.   Eyes:  Negative for pain.  Respiratory:  Negative for cough and stridor.   Cardiovascular:  Negative for chest pain.  Gastrointestinal:  Negative for vomiting.  Genitourinary:  Negative for dysuria.  Musculoskeletal:  Negative for myalgias.  Skin:  Negative for rash.  Neurological:  Negative for seizures, loss of consciousness and headaches.  Psychiatric/Behavioral:  Negative for suicidal ideas.   All other systems reviewed and are negative.    ____________________________________________   PHYSICAL EXAM:  VITAL SIGNS: ED Triage Vitals  Enc Vitals Group     BP 05/28/21 0919 119/81     Pulse Rate 05/28/21 0919 91     Resp 05/28/21 0919 18     Temp 05/28/21 0919 98.1 F (36.7 C)     Temp Source 05/28/21 0919 Oral  SpO2 05/28/21 0919 99 %     Weight 05/28/21 0919 220 lb 11.2 oz (100.1 kg)     Height 05/28/21 0919 5\' 3"  (1.6 m)     Head Circumference --      Peak Flow --      Pain Score 05/28/21 0919 0     Pain Loc --      Pain Edu? --      Excl. in [place]? --    Vitals:   05/28/21 0919  BP: 119/81  Pulse: 91  Resp: 18  Temp: 98.1 F (36.7 C)  SpO2: 99%   Physical Exam Vitals and nursing note reviewed.  Constitutional:      General: She is not in acute distress.    Appearance: She is well-developed. She is obese.  HENT:     Head: Normocephalic and atraumatic.     Right Ear: External ear normal.     Left Ear: External  ear normal.     Nose: Nose normal.  Eyes:     Conjunctiva/sclera: Conjunctivae normal.  Cardiovascular:     Rate and Rhythm: Normal rate and regular rhythm.     Heart sounds: No murmur heard. Pulmonary:     Effort: Pulmonary effort is normal. No respiratory distress.     Breath sounds: Normal breath sounds.  Abdominal:     Palpations: Abdomen is soft.     Tenderness: There is no abdominal tenderness. There is no right CVA tenderness or left CVA tenderness.  Musculoskeletal:     Cervical back: Neck supple.  Skin:    General: Skin is warm and dry.  Neurological:     Mental Status: She is alert and oriented to person, place, and time.  Psychiatric:        Mood and Affect: Mood normal.     ____________________________________________   LABS (all labs ordered are listed, but only abnormal results are displayed)  Labs Reviewed  URINALYSIS, ROUTINE W REFLEX MICROSCOPIC - Abnormal; Notable for the following components:      Result Value   Color, Urine YELLOW (*)    APPearance HAZY (*)    Hgb urine dipstick MODERATE (*)    Bacteria, UA RARE (*)    All other components within normal limits  CBC WITH DIFFERENTIAL/PLATELET - Abnormal; Notable for the following components:   WBC 12.6 (*)    Neutro Abs 9.2 (*)    All other components within normal limits  HCG, QUANTITATIVE, PREGNANCY - Abnormal; Notable for the following components:   hCG, Beta Chain, Quant, S 39,216 (*)    All other components within normal limits  POC URINE PREG, ED - Abnormal; Notable for the following components:   Preg Test, Ur POSITIVE (*)    All other components within normal limits  URINE CULTURE  ABO/RH   ____________________________________________  EKG  ____________________________________________  RADIOLOGY  ED MD interpretation: OB pelvic ultrasound shows a single live IUP at approximately 6 weeks 4 days by size.  There is evidence of embryonic bradycardia with a rate of 94 and a small  subchronic hemorrhage.  There is a little cyst on the right ovary but no free pelvic fluid or evidence of ectopic.  No other acute abnormalities noted.  Official radiology report(s): US OB LESS THAN 14 WEEKS WITH OB TRANSVAGINAL  Result Date: 05/28/2021 CLINICAL DATA:  Vaginal bleeding, early pregnancy. Quantitative beta HCG 39,216. EXAM: OBSTETRIC <14 WK [place] AND TRANSVAGINAL OB US TECHNIQUE: Both transabdominal and transvaginal ultrasound examinations were performed  for complete evaluation of the gestation as well as the maternal uterus, adnexal regions, and pelvic cul-de-sac. Transvaginal technique was performed to assess early pregnancy. COMPARISON:  None. FINDINGS: Intrauterine gestational sac: Present Yolk sac:  Present Embryo:  Present Cardiac Activity: Present Heart Rate: 94 bpm MSD: 19.8 mm   6 w   6 d CRL:  6.9 mm   6 w   4 d                  [place] EDC: 01/17/2022 Subchorionic hemorrhage: Small (0.2 cm in thickness on image 4 of series 1 -1). Maternal uterus/adnexae: 1.4 cm simple cyst in the right ovary. 1.8 by 1.9 by 1.3 cm left ovarian corpus luteum pregnancy. No free pelvic fluid. IMPRESSION: 1. Single living intrauterine pregnancy identified measuring at 6 weeks 4 days gestation. 2. Embryonic bradycardia with average heart rate of 94 beats per minute, which can have negative prognostic indications at this gestational age. There is also a small subchorionic hemorrhage. Electronically Signed   By: Van Clines M.D.   On: 05/28/2021 12:37    ____________________________________________   PROCEDURES  Procedure(s) performed (including Critical Care):  Procedures   ____________________________________________   INITIAL IMPRESSION / ASSESSMENT AND PLAN / ED COURSE      Patient presents with above-stated history exam for assessment of some painless vaginal bleeding in the setting of being approximately [redacted] weeks pregnant by last LMP.  No associated pain and symptoms only occurred after  urinating earlier today without subsequent episodes.  On arrival she is afebrile and hemodynamically stable.  Her abdomen is soft nontender throughout and she has no CVA tenderness.  Differential includes possible subchorionic hemorrhage, placenta previa, ectopic, cystitis and possible threatened miscarriage.  Pregnancy confirmed with urine POC emergency room.  UA with some rare bacteria and moderate hemoglobin but no other evidence of significant cystitis i.e. there are no leukocyte esterase, WBCs or nitrates.  However we will plan to treat this with a course of Keflex for asymptomatic bacteriuria in pregnancy.  Urine culture sent.  CBC shows slight leukocytosis with WBC count of 12.6 without evidence of anemia.  Low suspicion for any significant hemorrhage at this time given isolated episode with stable vitals and patient denying any other anemic symptoms i.e. lightheadedness, dizziness, shortness of breath or ongoing bleeding.  She is Rh+ and RhoGAM is not currently indicated.  OB pelvic ultrasound shows a single live IUP at approximately 6 weeks 4 days by size.  There is evidence of embryonic bradycardia with a rate of 94 and a small subchronic hemorrhage.  There is a little cyst on the right ovary but no free pelvic fluid or evidence of ectopic.  No other acute abnormalities noted.  I suspect the subchronic hemorrhage is likely the etiology of patient's painless bleeding today.  We will treat her bacteriuria with a course of Keflex.  Given stable vitals with absence of any pain or other anemic symptoms and normal hemoglobin I think she is stable for discharge with close outpatient OB follow-up.  Discussed findings on ultrasound including bradycardia which can portend a poor diagnosis for fetus including miscarriage.  Patient denies any other acute concerns at this time.  Discharged in stable condition.  Strict return cautions advised and discussed.       ____________________________________________   FINAL CLINICAL IMPRESSION(S) / ED DIAGNOSES  Final diagnoses:  First trimester bleeding  Subchorionic hemorrhage of placenta in first trimester, single or unspecified fetus  Bacteriuria during pregnancy  Medications - No data to display   ED Discharge Orders          Ordered    cephALEXin (KEFLEX) 500 MG capsule  4 times daily        05/28/21 1254             Note:  This document was prepared using Dragon voice recognition software and may include unintentional dictation errors.    Lucrezia Starch, MD 05/28/21 1258

## 2021-05-29 LAB — URINE CULTURE: Culture: <10000 — AB

## 2021-06-03 ENCOUNTER — Other Ambulatory Visit: Payer: Self-pay | Admitting: Certified Nurse Midwife

## 2021-06-03 ENCOUNTER — Other Ambulatory Visit: Payer: Self-pay

## 2021-06-03 ENCOUNTER — Ambulatory Visit (INDEPENDENT_AMBULATORY_CARE_PROVIDER_SITE_OTHER): Payer: PRIVATE HEALTH INSURANCE

## 2021-06-03 DIAGNOSIS — Z3200 Encounter for pregnancy test, result unknown: Secondary | ICD-10-CM

## 2021-06-17 ENCOUNTER — Other Ambulatory Visit: Payer: Self-pay

## 2021-06-17 ENCOUNTER — Ambulatory Visit (INDEPENDENT_AMBULATORY_CARE_PROVIDER_SITE_OTHER): Payer: PRIVATE HEALTH INSURANCE | Admitting: Certified Nurse Midwife

## 2021-06-17 VITALS — BP 109/75 | HR 94 | Wt 217.4 lb

## 2021-06-17 DIAGNOSIS — Z3A10 10 weeks gestation of pregnancy: Secondary | ICD-10-CM

## 2021-06-17 DIAGNOSIS — Z113 Encounter for screening for infections with a predominantly sexual mode of transmission: Secondary | ICD-10-CM | POA: Diagnosis not present

## 2021-06-17 DIAGNOSIS — O99210 Obesity complicating pregnancy, unspecified trimester: Secondary | ICD-10-CM | POA: Diagnosis not present

## 2021-06-17 DIAGNOSIS — Z3401 Encounter for supervision of normal first pregnancy, first trimester: Secondary | ICD-10-CM | POA: Diagnosis not present

## 2021-06-17 NOTE — Progress Notes (Signed)
Analisse Wenz presents for NOB nurse interview visit. Pregnancy confirmation done 05/13/2022.  [id number]. Pregnancy education material was not  explained or given, due to patient moving to [place] tomorrow. No cats in the home. NOB labs ordered. TSH/HbgA1c due to Increased BMI. Body mass index is 38.51 kg/m. Sickle cell ordered.  HIV labs and Drug screen were explained optional and she did not decline. Drug screen ordered. PNV encouraged. Genetic screening options were not discussed. Genetic testing: not done  Pt may discuss with provider.  Financial policy not reviewed. FMLA form not  reviewed or signed. Pt. To follow up with new OB/GYN in [place] once she gets settled.

## 2021-06-17 NOTE — Patient Instructions (Incomplete)
Morning Sickness Morning sickness is when you feel like you may vomit (feel nauseous) during pregnancy. Sometimes, you may vomit. Morning sickness most often happens in the morning, but it can also happen at any time of the day. Some women may have morning sickness that makes them vomit all the time. This is a more serious problem that needs treatment. What are the causes? The cause of this condition is not known. What increases the risk? You had vomiting or a feeling like you may vomit before your pregnancy. You had morning sickness in another pregnancy. You are pregnant with more than one baby, such as twins. What are the signs or symptoms? Feeling like you may vomit. Vomiting. How is this treated? Treatment is usually not needed for this condition. You may only need to change what you eat. In some cases, your doctor may give you some things to take for your condition. These include: Vitamin B6 supplements. Medicines to treat the feeling that you may vomit. Ginger. Follow these instructions at home: Medicines Take over-the-counter and prescription medicines only as told by your doctor. Do not take any medicines until you talk with your doctor about them first. Take multivitamins before you get pregnant. These can stop or lessen the symptoms of morning sickness. Eating and drinking Eat dry toast or crackers before getting out of bed. Eat 5 or 6 small meals a day. Eat dry and bland foods like rice and baked potatoes. Do not eat greasy, fatty, or spicy foods. Have someone cook for you if the smell of food causes you to vomit or to feel like you may vomit. If you feel like you may vomit after taking prenatal vitamins, take them at night or with a snack. Eat protein foods when you need a snack. Nuts, yogurt, and cheese are good choices. Drink fluids throughout the day. Try ginger ale made with real ginger, ginger tea made from fresh grated ginger, or ginger candies. General  instructions Do not smoke or use any products that contain nicotine or tobacco. If you need help quitting, ask your doctor. Use an air purifier to keep the air in your house free of smells. Get lots of fresh air. Try to avoid smells that make you feel sick. Try wearing an acupressure wristband. This is a wristband that is used to treat seasickness. Try a treatment called acupuncture. In this treatment, a doctor puts needles into certain areas of your body to make you feel better. Contact a doctor if: You need medicine to feel better. You feel dizzy or light-headed. You are losing weight. Get help right away if: The feeling that you may vomit will not go away, or you cannot stop vomiting. You faint. You have very bad pain in your belly. Summary Morning sickness is when you feel like you may vomit (feel nauseous) during pregnancy. You may feel sick in the morning, but you can feel this way at any time of the day. Making some changes to what you eat may help your symptoms go away. This information is not intended to replace advice given to you by your health care provider. Make sure you discuss any questions you have with your health care provider. Document Revised: 02/20/2020 Document Reviewed: 01/30/2020 Elsevier Patient Education  2022 [place] of Pregnancy The first trimester of pregnancy starts on the first day of your last menstrual period until the end of week 12. This is also called months 1 through 3 of pregnancy. Body changes during your  first trimester Your body goes through many changes during pregnancy. The changes usually return to normal after your baby is born. Physical changes You may gain or lose weight. Your breasts may grow larger and hurt. The area around your nipples may get darker. Dark spots or blotches may develop on your face. You may have changes in your hair. Health changes You may feel like you might vomit (nauseous), and you may  vomit. You may have heartburn. You may have headaches. You may have trouble pooping (constipation). Your gums may bleed. Other changes You may get tired easily. You may pee (urinate) more often. Your menstrual periods will stop. You may not feel hungry. You may want to eat certain kinds of food. You may have changes in your emotions from day to day. You may have more dreams. Follow these instructions at home: Medicines Take over-the-counter and prescription medicines only as told by your doctor. Some medicines are not safe during pregnancy. Take a prenatal vitamin that contains at least 600 micrograms (mcg) of folic acid. Eating and drinking Eat healthy meals that include: Fresh fruits and vegetables. Whole grains. Good sources of protein, such as meat, eggs, or tofu. Low-fat dairy products. Avoid raw meat and unpasteurized juice, milk, and cheese. If you feel like you may vomit, or you vomit: Eat 4 or 5 small meals a day instead of 3 large meals. Try eating a few soda crackers. Drink liquids between meals instead of during meals. You may need to take these actions to prevent or treat trouble pooping: Drink enough fluids to keep your pee (urine) pale yellow. Eat foods that are high in fiber. These include beans, whole grains, and fresh fruits and vegetables. Limit foods that are high in fat and sugar. These include fried or sweet foods. Activity Exercise only as told by your doctor. Most people can do their usual exercise routine during pregnancy. Stop exercising if you have cramps or pain in your lower belly (abdomen) or low back. Do not exercise if it is too hot or too humid, or if you are in a place of great height (high altitude). Avoid heavy lifting. If you choose to, you may have sex unless your doctor tells you not to. Relieving pain and discomfort Wear a good support bra if your breasts are sore. Rest with your legs raised (elevated) if you have leg cramps or low back  pain. If you have bulging veins (varicose veins) in your legs: Wear support hose as told by your doctor. Raise your feet for 15 minutes, 3-4 times a day. Limit salt in your food. Safety Wear your seat belt at all times when you are in a car. Talk with your doctor if someone is hurting you or yelling at you. Talk with your doctor if you are feeling sad or have thoughts of hurting yourself. Lifestyle Do not use hot tubs, steam rooms, or saunas. Do not douche. Do not use tampons or scented sanitary pads. Do not use herbal medicines, illegal drugs, or medicines that are not approved by your doctor. Do not drink alcohol. Do not smoke or use any products that contain nicotine or tobacco. If you need help quitting, ask your doctor. Avoid cat litter boxes and soil that is used by cats. These carry germs that can cause harm to the baby and can cause a loss of your baby by miscarriage or stillbirth. General instructions Keep all follow-up visits. This is important. Ask for help if you need counseling or if you  need help with nutrition. Your doctor can give you advice or tell you where to go for help. Visit your dentist. At home, brush your teeth with a soft toothbrush. Floss gently. Write down your questions. Take them to your prenatal visits. Where to find more information American Pregnancy Association: americanpregnancy.org [employer]: www.acog.org Office on Women's Health: [employer].br Contact a doctor if: You are dizzy. You have a fever. You have mild cramps or pressure in your lower belly. You have a nagging pain in your belly area. You continue to feel like you may vomit, you vomit, or you have watery poop (diarrhea) for 24 hours or longer. You have a bad-smelling fluid coming from your vagina. You have pain when you pee. You are exposed to a disease that spreads from person to person, such as chickenpox, measles, Zika virus, HIV, or  hepatitis. Get help right away if: You have spotting or bleeding from your vagina. You have very bad belly cramping or pain. You have shortness of breath or chest pain. You have any kind of injury, such as from a fall or a car crash. You have new or increased pain, swelling, or redness in an arm or leg. Summary The first trimester of pregnancy starts on the first day of your last menstrual period until the end of week 12 (months 1 through 3). Eat 4 or 5 small meals a day instead of 3 large meals. Do not smoke or use any products that contain nicotine or tobacco. If you need help quitting, ask your doctor. Keep all follow-up visits. This information is not intended to replace advice given to you by your health care provider. Make sure you discuss any questions you have with your health care provider. Document Revised: 12/14/2019 Document Reviewed: 10/20/2019 Elsevier Patient Education  2022 [place]. Commonly Asked Questions During Pregnancy  Cats: A parasite can be excreted in cat feces.  To avoid exposure you need to have another person empty the little box.  If you must empty the litter box you will need to wear gloves.  Wash your hands after handling your cat.  This parasite can also be found in raw or undercooked meat so this should also be avoided.  Colds, Sore Throats, Flu: Please check your medication sheet to see what you can take for symptoms.  If your symptoms are unrelieved by these medications please call the office.  Dental Work: Most any dental work Investment banker, corporate recommends is permitted.  X-rays should only be taken during the first trimester if absolutely necessary.  Your abdomen should be shielded with a lead apron during all x-rays.  Please notify your provider prior to receiving any x-rays.  Novocaine is fine; gas is not recommended.  If your dentist requires a note from [place] prior to dental work please call the office and we will provide one for you.  Exercise: Exercise is  an important part of staying healthy during your pregnancy.  You may continue most exercises you were accustomed to prior to pregnancy.  Later in your pregnancy you will most likely notice you have difficulty with activities requiring balance like riding a bicycle.  It is important that you listen to your body and avoid activities that put you at a higher risk of falling.  Adequate rest and staying well hydrated are a must!  If you have questions about the safety of specific activities ask your provider.    Exposure to Children with illness: Try to avoid obvious  exposure; report any symptoms to [place] when noted,  If you have chicken pos, red measles or mumps, you should be immune to these diseases.   Please do not take any vaccines while pregnant unless you have checked with your OB provider.  Fetal Movement: After 28 weeks we recommend you do "kick counts" twice daily.  Lie or sit down in a calm quiet environment and count your baby movements "kicks".  You should feel your baby at least 10 times per hour.  If you have not felt 10 kicks within the first hour get up, walk around and have something sweet to eat or drink then repeat for an additional hour.  If count remains less than 10 per hour notify your provider.  Fumigating: Follow your pest control agent's advice as to how long to stay out of your home.  Ventilate the area well before re-entering.  Hemorrhoids:   Most over-the-counter preparations can be used during pregnancy.  Check your medication to see what is safe to use.  It is important to use a stool softener or fiber in your diet and to drink lots of liquids.  If hemorrhoids seem to be getting worse please call the office.   Hot Tubs:  Hot tubs Jacuzzis and saunas are not recommended while pregnant.  These increase your internal body temperature and should be avoided.  Intercourse:  Sexual intercourse is safe during pregnancy as long as you are comfortable, unless otherwise advised by your  provider.  Spotting may occur after intercourse; report any bright red bleeding that is heavier than spotting.  Labor:  If you know that you are in labor, please go to the hospital.  If you are unsure, please call the office and let us help you decide what to do.  Lifting, straining, etc:  If your job requires heavy lifting or straining please check with your provider for any limitations.  Generally, you should not lift items heavier than that you can lift simply with your hands and arms (no back muscles)  Painting:  Paint fumes do not harm your pregnancy, but may make you ill and should be avoided if possible.  Latex or water based paints have less odor than oils.  Use adequate ventilation while painting.  Permanents & Hair Color:  Chemicals in hair dyes are not recommended as they cause increase hair dryness which can increase hair loss during pregnancy.  " Highlighting" and permanents are allowed.  Dye may be absorbed differently and permanents may not hold as well during pregnancy.  Sunbathing:  Use a sunscreen, as skin burns easily during pregnancy.  Drink plenty of fluids; avoid over heating.  Tanning Beds:  Because their possible side effects are still unknown, tanning beds are not recommended.  Ultrasound Scans:  Routine ultrasounds are performed at approximately 20 weeks.  You will be able to see your baby's general anatomy an if you would like to know the gender this can usually be determined as well.  If it is questionable when you conceived you may also receive an ultrasound early in your pregnancy for dating purposes.  Otherwise ultrasound exams are not routinely performed unless there is a medical necessity.  Although you can request a scan we ask that you pay for it when conducted because insurance does not cover " patient request" scans.  Work: If your pregnancy proceeds without complications you may work until your due date, unless your physician or employer advises  otherwise.  Round Ligament Pain/Pelvic Discomfort:  Hervey Ard,  shooting pains not associated with bleeding are fairly common, usually occurring in the second trimester of pregnancy.  They tend to be worse when standing up or when you remain standing for long periods of time.  These are the result of pressure of certain pelvic ligaments called "round ligaments".  Rest, Tylenol and heat seem to be the most effective relief.  As the womb and fetus grow, they rise out of the pelvis and the discomfort improves.  Please notify the office if your pain seems different than that described.  It may represent a more serious condition.   WATERBIRTH  The next 4 dates are as follows:   Jul 09, 2021 (Tuesday) from 6-8 PM  Aug 08, 2021  (Thursday) from 6-8 PM  Sep 05, 2021 (Thursday) from 6-8 PM  October 03, 2021 (Thursday) from 6-8 PM     Interested in a waterbirth?  This informational class will help you discover whether waterbirth is the right fit for you.  Education about waterbirth itself, supplies you would need and how to assemble your support team is what you can expect from this class.  Some obstetrical practices require this class in order to pursue a waterbirth.  (Not all obstetrical practices offer waterbirth check with your healthcare provider)  Register only the expectant mom, but you are encouraged to bring your partner to class!  Fees & Payment No fee  Register Online [email or web address] Search Doren Custard

## 2021-06-18 LAB — URINALYSIS, ROUTINE W REFLEX MICROSCOPIC

## 2021-06-18 LAB — GC/CHLAMYDIA PROBE AMP
Chlamydia trachomatis, NAA: NEGATIVE
Neisseria Gonorrhoeae by PCR: NEGATIVE

## 2021-06-19 LAB — PAIN MGT SCRN (14 DRUGS), UR
Amphetamine Scrn, Ur: NEGATIVE ng/mL
BARBITURATE SCREEN URINE: NEGATIVE ng/mL
BENZODIAZEPINE SCREEN, URINE: NEGATIVE ng/mL
Buprenorphine, Urine: NEGATIVE ng/mL
CANNABINOIDS UR QL SCN: POSITIVE ng/mL — AB
Cocaine (Metab) Scrn, Ur: NEGATIVE ng/mL
Creatinine(Crt), U: 325.4 mg/dL — ABNORMAL HIGH (ref 20.0–300.0)
Fentanyl, Urine: NEGATIVE pg/mL
Meperidine Screen, Urine: NEGATIVE ng/mL
Methadone Screen, Urine: NEGATIVE ng/mL
OXYCODONE+OXYMORPHONE UR QL SCN: NEGATIVE ng/mL
Opiate Scrn, Ur: NEGATIVE ng/mL
Ph of Urine: 6 (ref 4.5–8.9)
Phencyclidine Qn, Ur: NEGATIVE ng/mL
Propoxyphene Scrn, Ur: NEGATIVE ng/mL
Tramadol Screen, Urine: NEGATIVE ng/mL

## 2021-06-19 LAB — VIRAL HEPATITIS HBV, HCV
HCV Ab: <0.1 s/co ratio (ref 0.0–0.9)
Hep B Core Total Ab: NEGATIVE
Hep B Surface Ab, Qual: REACTIVE
Hepatitis B Surface Ag: NEGATIVE

## 2021-06-19 LAB — ABO AND RH: Rh Factor: POSITIVE

## 2021-06-19 LAB — TSH: TSH: 0.657 u[IU]/mL (ref 0.450–4.500)

## 2021-06-19 LAB — PARVOVIRUS B19 ANTIBODY, IGG AND IGM
Parvovirus B19 IgG: 0.3 index (ref 0.0–0.8)
Parvovirus B19 IgM: 0.3 index (ref 0.0–0.8)

## 2021-06-19 LAB — HCV INTERPRETATION

## 2021-06-19 LAB — CULTURE, OB URINE

## 2021-06-19 LAB — RPR: RPR Ser Ql: NONREACTIVE

## 2021-06-19 LAB — HGB SOLU + RFLX FRAC: Sickle Solubility Test - HGBRFX: NEGATIVE

## 2021-06-19 LAB — HEMOGLOBIN A1C
Est. average glucose Bld gHb Est-mCnc: 103 mg/dL
Hgb A1c MFr Bld: 5.2 % (ref 4.8–5.6)

## 2021-06-19 LAB — HIV ANTIBODY (ROUTINE TESTING W REFLEX): HIV Screen 4th Generation wRfx: NONREACTIVE

## 2021-06-19 LAB — VARICELLA ZOSTER ANTIBODY, IGG: Varicella zoster IgG: 163 index — ABNORMAL LOW (ref >165)

## 2021-06-19 LAB — RUBELLA SCREEN: Rubella Antibodies, IGG: 1.73 index (ref >0.99)

## 2021-06-19 LAB — ANTIBODY SCREEN: Antibody Screen: NEGATIVE

## 2021-06-19 LAB — URINE CULTURE, OB REFLEX

## 2021-06-20 DIAGNOSIS — Z419 Encounter for procedure for purposes other than remedying health state, unspecified: Secondary | ICD-10-CM | POA: Diagnosis not present

## 2021-07-08 ENCOUNTER — Encounter: Payer: PRIVATE HEALTH INSURANCE | Admitting: Certified Nurse Midwife

## 2021-07-08 DIAGNOSIS — Z3401 Encounter for supervision of normal first pregnancy, first trimester: Secondary | ICD-10-CM

## 2021-07-08 DIAGNOSIS — Z3A11 11 weeks gestation of pregnancy: Secondary | ICD-10-CM

## 2021-07-21 DIAGNOSIS — Z419 Encounter for procedure for purposes other than remedying health state, unspecified: Secondary | ICD-10-CM | POA: Diagnosis not present

## 2021-07-21 NOTE — L&D Delivery Note (Signed)
       Delivery Note   Jessica Keith is a 21 y.o. G1P0000 at 39w0dEstimated Date of Delivery: 01/19/22  PRE-OPERATIVE DIAGNOSIS:  1) 424w0dregnancy.    POST-OPERATIVE DIAGNOSIS:  1) 4044w0degnancy s/p Vaginal, Spontaneous    Delivery Type: Vaginal, Spontaneous    Delivery Anesthesia: Epidural   Labor Complications:  none    ESTIMATED BLOOD LOSS: 400  ml    FINDINGS:   1) female infant, Apgar scores of    at 1 minute and    at 5 minutes and a birthweight of   ounces.    2) Nuchal cord: none  SPECIMENS:   PLACENTA:   Appearance: Intact , 3 vessel cord, cord blood sample collected   Removal: Spontaneous      Disposition:  per protocol  DISPOSITION:  Infant to left in stable condition in the delivery room, with L&D personnel and mother,  NARRATIVE SUMMARY: Labor course:  Ms. Jessica Keith a G1P0000 at 40w78w0d presented for labor management.  She progressed well in labor with pitocin.  She received the appropriate epidural anesthesia and proceeded to complete dilation. She evidenced good maternal expulsive effort during the second stage. She went on to deliver a viable female infant "Ayanna" . The placenta delivered without problems and was noted to be complete. A perineal and vaginal examination was performed. Episiotomy/Lacerations: Labial;Perineal Right & left labial and perineal abrasion. Hemostatic not in need of repair.  The patient tolerated this well.  AnniPhilip AspenM  01/19/2022 2:31 AM

## 2021-08-21 DIAGNOSIS — Z419 Encounter for procedure for purposes other than remedying health state, unspecified: Secondary | ICD-10-CM | POA: Diagnosis not present

## 2021-09-18 DIAGNOSIS — Z419 Encounter for procedure for purposes other than remedying health state, unspecified: Secondary | ICD-10-CM | POA: Diagnosis not present

## 2021-09-24 ENCOUNTER — Other Ambulatory Visit: Payer: Self-pay

## 2021-09-24 ENCOUNTER — Emergency Department (HOSPITAL_COMMUNITY)
Admission: EM | Admit: 2021-09-24 | Discharge: 2021-09-24 | Disposition: A | Discharge status: Home / Self Care | Payer: Medicaid Other | Attending: Emergency Medicine | Admitting: Emergency Medicine

## 2021-09-24 ENCOUNTER — Encounter (HOSPITAL_COMMUNITY): Payer: Self-pay

## 2021-09-24 DIAGNOSIS — R1013 Epigastric pain: Secondary | ICD-10-CM | POA: Diagnosis not present

## 2021-09-24 DIAGNOSIS — O219 Vomiting of pregnancy, unspecified: Secondary | ICD-10-CM | POA: Diagnosis not present

## 2021-09-24 DIAGNOSIS — O26892 Other specified pregnancy related conditions, second trimester: Secondary | ICD-10-CM | POA: Insufficient documentation

## 2021-09-24 DIAGNOSIS — Z3A23 23 weeks gestation of pregnancy: Secondary | ICD-10-CM | POA: Diagnosis not present

## 2021-09-24 DIAGNOSIS — R1010 Upper abdominal pain, unspecified: Secondary | ICD-10-CM

## 2021-09-24 LAB — COMPREHENSIVE METABOLIC PANEL
ALT: 20 U/L (ref 0–44)
AST: 19 U/L (ref 15–41)
Albumin: 3.4 g/dL — ABNORMAL LOW (ref 3.5–5.0)
Alkaline Phosphatase: 86 U/L (ref 38–126)
Anion gap: 7 (ref 5–15)
BUN: 11 mg/dL (ref 6–20)
CO2: 21 mmol/L — ABNORMAL LOW (ref 22–32)
Calcium: 9.1 mg/dL (ref 8.9–10.3)
Chloride: 105 mmol/L (ref 98–111)
Creatinine, Ser: 0.56 mg/dL (ref 0.44–1.00)
GFR, Estimated: >60 mL/min (ref >60)
Glucose, Bld: 106 mg/dL — ABNORMAL HIGH (ref 70–99)
Potassium: 3.7 mmol/L (ref 3.5–5.1)
Sodium: 133 mmol/L — ABNORMAL LOW (ref 135–145)
Total Bilirubin: 0.4 mg/dL (ref 0.3–1.2)
Total Protein: 8 g/dL (ref 6.5–8.1)

## 2021-09-24 LAB — CBC WITH DIFFERENTIAL/PLATELET
Abs Immature Granulocytes: 0.15 10*3/uL — ABNORMAL HIGH (ref 0.00–0.07)
Basophils Absolute: 0 10*3/uL (ref 0.0–0.1)
Basophils Relative: 0 %
Eosinophils Absolute: 0.1 10*3/uL (ref 0.0–0.5)
Eosinophils Relative: 1 %
HCT: 35.7 % — ABNORMAL LOW (ref 36.0–46.0)
Hemoglobin: 12 g/dL (ref 12.0–15.0)
Immature Granulocytes: 1 %
Lymphocytes Relative: 10 %
Lymphs Abs: 1.8 10*3/uL (ref 0.7–4.0)
MCH: 30 pg (ref 26.0–34.0)
MCHC: 33.6 g/dL (ref 30.0–36.0)
MCV: 89.3 fL (ref 80.0–100.0)
Monocytes Absolute: 1 10*3/uL (ref 0.1–1.0)
Monocytes Relative: 6 %
Neutro Abs: 14.3 10*3/uL — ABNORMAL HIGH (ref 1.7–7.7)
Neutrophils Relative %: 82 %
Platelets: 277 10*3/uL (ref 150–400)
RBC: 4 MIL/uL (ref 3.87–5.11)
RDW: 13.2 % (ref 11.5–15.5)
WBC: 17.3 10*3/uL — ABNORMAL HIGH (ref 4.0–10.5)
nRBC: 0 % (ref 0.0–0.2)

## 2021-09-24 LAB — URINALYSIS, ROUTINE W REFLEX MICROSCOPIC
Bilirubin Urine: NEGATIVE
Glucose, UA: NEGATIVE mg/dL
Hgb urine dipstick: NEGATIVE
Ketones, ur: 5 mg/dL — AB
Leukocytes,Ua: NEGATIVE
Nitrite: NEGATIVE
Protein, ur: 30 mg/dL — AB
Specific Gravity, Urine: 1.028 (ref 1.005–1.030)
pH: 5 (ref 5.0–8.0)

## 2021-09-24 LAB — LIPASE, BLOOD: Lipase: 25 U/L (ref 11–51)

## 2021-09-24 MED ORDER — METOCLOPRAMIDE HCL 5 MG/ML IJ SOLN
5.0000 mg | Freq: Once | INTRAMUSCULAR | Status: AC
Start: 2021-09-24 — End: 2021-09-24
  Administered 2021-09-24: 5 mg via INTRAVENOUS
  Filled 2021-09-24: qty 2

## 2021-09-24 MED ORDER — SODIUM CHLORIDE 0.9 % IV BOLUS
1000.0000 mL | Freq: Once | INTRAVENOUS | Status: AC
  Administered 2021-09-24: 1000 mL via INTRAVENOUS

## 2021-09-24 MED ORDER — ONDANSETRON HCL 4 MG/2ML IJ SOLN
4.0000 mg | Freq: Once | INTRAMUSCULAR | Status: AC
  Administered 2021-09-24: 4 mg via INTRAVENOUS
  Filled 2021-09-24: qty 2

## 2021-09-24 MED ORDER — ALUM & MAG HYDROXIDE-SIMETH 200-200-20 MG/5ML PO SUSP
15.0000 mL | Freq: Once | ORAL | Status: AC
  Administered 2021-09-24: 15 mL via ORAL
  Filled 2021-09-24: qty 30

## 2021-09-24 MED ORDER — ACETAMINOPHEN 325 MG PO TABS
650.0000 mg | ORAL_TABLET | Freq: Once | ORAL | Status: AC
  Administered 2021-09-24: 650 mg via ORAL
  Filled 2021-09-24: qty 2

## 2021-09-24 MED ORDER — ONDANSETRON 4 MG PO TBDP: 4.0000 mg | ORAL_TABLET | Freq: Three times a day (TID) | ORAL | 0 refills | Status: AC | PRN

## 2021-09-24 NOTE — ED Provider Notes (Signed)
[facility] DEPT Provider Note  CSN: [id number] Arrival date & time: 09/24/21 1740  Chief Complaint(s) Abdominal Pain and Emesis  HPI Jessica Keith is a 21 y.o. female currently [redacted] weeks pregnant, here for epigastric abdominal cramping with several bouts of nonbloody nonbilious emesis.  Pain began earlier in the evening after dinner.  She reports that she ate burger and fries.  Shortly thereafter she began having the abdominal cramping which is fluctuating in nature and occurs every 15 to 20 minutes lasting 1 to 2 minutes at a time.  She has no lower abdominal pain or discomfort.  No urinary symptoms.  No vaginal discharge or bleeding.  No recent fevers or infections.  No coughing or congestion.  No known contacts.  The history is provided by the patient.   Past Medical History History reviewed. No pertinent past medical history. Patient Active Problem List   Diagnosis Date Noted   Obesity (BMI 30-39.9) 09/17/2020   NEVUS, BENIGN 03/01/2010   Home Medication(s) Prior to Admission medications   Medication Sig Start Date End Date Taking? Authorizing Provider  Doxylamine-Pyridoxine 10-10 MG TBEC Take 1 tablet by mouth 4 (four) times daily. Day 1 &2: 2 tablet at bedtimeDay 3 : if symptoms persists 1 tablet am; 2 tablet at bedtimeDay 4: 1 tablet am, 1 tab afternoon, 2 tab at bedtime 05/13/21  Yes Philip Aspen, CNM  ondansetron (ZOFRAN ODT) 4 MG disintegrating tablet Take 1 tablet (4 mg total) by mouth every 8 (eight) hours as needed for nausea or vomiting. 12/06/20  Yes Bradler, Vista Lawman, MD  ondansetron (ZOFRAN-ODT) 4 MG disintegrating tablet Take 1 tablet (4 mg total) by mouth every 8 (eight) hours as needed for up to 3 days for nausea or vomiting. 09/24/21 09/27/21 Yes Prestyn Mahn, Grayce Sessions, MD  Prenatal Vit-Fe Fumarate-FA (PRENATAL PO) Take 1 tablet by mouth daily.    [provider]                                                                                                                                     Allergies Patient has no known allergies.  Review of Systems Review of Systems As noted in HPI  Physical Exam Vital Signs  I have reviewed the triage vital signs BP (!) 91/58    Pulse 98    Temp 98.7 F (37.1 C) (Oral)    Resp 18    Ht '5\' 3"'$  (1.6 m)    Wt 94.3 kg    LMP 04/08/2021    SpO2 99%    BMI 36.85 kg/m   FHT 136  Physical Exam Vitals reviewed.  Constitutional:      General: She is not in acute distress.    Appearance: She is well-developed. She is not diaphoretic.  HENT:     Head: Normocephalic and atraumatic.     Right Ear: External ear normal.     Left Ear: External  ear normal.     Nose: Nose normal.  Eyes:     General: No scleral icterus.    Conjunctiva/sclera: Conjunctivae normal.  Neck:     Trachea: Phonation normal.  Cardiovascular:     Rate and Rhythm: Normal rate and regular rhythm.  Pulmonary:     Effort: Pulmonary effort is normal. No respiratory distress.     Breath sounds: No stridor.  Abdominal:     General: There is no distension.     Tenderness: There is abdominal tenderness (mild) in the epigastric area. There is no guarding or rebound.     Comments: Gravid wit fundus just above umbilicus  Musculoskeletal:        General: Normal range of motion.     Cervical back: Normal range of motion.  Neurological:     Mental Status: She is alert and oriented to person, place, and time.  Psychiatric:        Behavior: Behavior normal.    ED Results and Treatments Labs (all labs ordered are listed, but only abnormal results are displayed) Labs Reviewed  CBC WITH DIFFERENTIAL/PLATELET - Abnormal; Notable for the following components:      Result Value   WBC 17.3 (*)    HCT 35.7 (*)    Neutro Abs 14.3 (*)    Abs Immature Granulocytes 0.15 (*)    All other components within normal limits  COMPREHENSIVE METABOLIC PANEL - Abnormal; Notable for the following components:   Sodium 133 (*)     CO2 21 (*)    Glucose, Bld 106 (*)    Albumin 3.4 (*)    All other components within normal limits  URINALYSIS, ROUTINE W REFLEX MICROSCOPIC - Abnormal; Notable for the following components:   APPearance HAZY (*)    Ketones, ur 5 (*)    Protein, ur 30 (*)    Bacteria, UA RARE (*)    All other components within normal limits  LIPASE, BLOOD                                                                                                                         EKG  EKG Interpretation  Date/Time:    Ventricular Rate:    PR Interval:    QRS Duration:   QT Interval:    QTC Calculation:   R Axis:     Text Interpretation:         Radiology No results found.  Pertinent labs & imaging results that were available during my care of the patient were reviewed by me and considered in my medical decision making (see MDM for details).  Medications Ordered in ED Medications  metoCLOPramide (REGLAN) injection 5 mg (5 mg Intravenous Given 09/24/21 0401)  sodium chloride 0.9 % bolus 1,000 mL (1,000 mLs Intravenous New Bag/Given 09/24/21 0400)  alum & mag hydroxide-simeth (MAALOX/MYLANTA) 200-200-20 MG/5ML suspension 15 mL (15 mLs Oral Given 09/24/21 0519)  ondansetron (ZOFRAN) injection 4 mg (4 mg Intravenous Given 09/24/21 0550)  acetaminophen (TYLENOL) tablet 650 mg (650 mg Oral Given 09/24/21 0552)                                                                                                                                     Procedures Ultrasound ED Abd  Date/Time: 09/24/2021 5:27 AM Performed by: Fatima Blank, MD Authorized by: Fatima Blank, MD   Procedure details:    Indications: abdominal pain     Assessment for:  Gallstones and intra-abdominal fluid   Hepatobiliary:  Visualized    Images: archived    Hepatobiliary findings:    Common bile duct:  Normal   Gallbladder wall:  Normal   Gallbladder stones: not identified     Intra-abdominal fluid: not identified      Sonographic Murphy's sign: negative    (including critical care time)  Medical Decision Making / ED Course    Complexity of Problem:  Co-morbidities/SDOH that complicate the patient evaluation/care: Pregnancy  Additional history obtained: None  Patient's presenting problem/concern and DDX listed below: Upper abdominal discomfort Gastritis, pancreatitis, biliary colic, acute cholecystitis, viral gastroenteritis Premature labor Lower concern for appendicitis given the lack of TTP to right lower, mid, or upper regions.     Complexity of Data:   Cardiac Monitoring: None  Laboratory Tests ordered listed below with my independent interpretation: CBC with leukocytosis.  No anemia. No significant electrolyte derangements or renal sufficiency.  No evidence of bili obstruction or pancreatitis UA without evidence of infection.   Imaging Studies ordered listed below with my independent interpretation: POCUS as noted above w/o evidence of acute cholecystitis or biliary obstruction.     ED Course:    Hospitalization Considered:  Yes  Assessment, Intervention, and Reassessment: Abdominal discomfort  Patient provided with IV antiemtecs and IV fluids Patient also given low-dose Maalox and tylenol Reports pain improvement. Tolerating PO  Final Clinical Impression(s) / ED Diagnoses Final diagnoses:  Upper abdominal pain  Nausea/vomiting in pregnancy   The patient appears reasonably screened and/or stabilized for discharge and I doubt any other medical condition or other [facility] requiring further screening, evaluation, or treatment in the ED at this time prior to discharge. Safe for discharge with strict return precautions.  Disposition: Discharge  Condition: Good  I have discussed the results, Dx and Tx plan with the patient/family who expressed understanding and agree(s) with the plan. Discharge instructions discussed at length. The patient/family was given strict return  precautions who verbalized understanding of the instructions. No further questions at time of discharge.    ED Discharge Orders          Ordered    ondansetron (ZOFRAN-ODT) 4 MG disintegrating tablet  Every 8 hours PRN        09/24/21 0705             Follow Up: Philip Aspen, [address] [place] [address] [phone]  Call  as needed           This chart was dictated using voice recognition software.  Despite best efforts to proofread,  errors can occur which can change the documentation meaning.    Fatima Blank, MD 09/24/21 [phone]

## 2021-09-24 NOTE — ED Notes (Signed)
PO fluid and crackers tolerated. No complaints of pain. No vomiting. VSS. Gait steady.  ?

## 2021-09-24 NOTE — ED Triage Notes (Addendum)
Pt reports having abdominal pain that is coming and going every 15-20 mins since yesterday. Pt states that she has been vomiting multiple times. Pt reports being [redacted] weeks pregnant.  ?

## 2021-10-02 ENCOUNTER — Ambulatory Visit (INDEPENDENT_AMBULATORY_CARE_PROVIDER_SITE_OTHER): Payer: Medicaid Other | Admitting: Certified Nurse Midwife

## 2021-10-02 ENCOUNTER — Other Ambulatory Visit: Payer: Self-pay

## 2021-10-02 VITALS — BP 113/76 | HR 112 | Wt 215.8 lb

## 2021-10-02 DIAGNOSIS — Z3A24 24 weeks gestation of pregnancy: Secondary | ICD-10-CM

## 2021-10-02 NOTE — Patient Instructions (Signed)
Oral Glucose Tolerance Test During Pregnancy ?Why am I having this test? ?The oral glucose tolerance test (OGTT) is done to check how your body processes blood sugar (glucose). This is one of several tests used to diagnose diabetes that develops during pregnancy (gestational diabetes mellitus). Gestational diabetes is a short-term form of diabetes that some women develop while they are pregnant. It usually occurs during the second trimester of pregnancy and goes away after delivery. ?Testing, or screening, for gestational diabetes usually occurs at weeks 24-28 of pregnancy. You may have the OGTT test after having a 1-hour glucose screening test if the results from that test indicate that you may have gestational diabetes. This test may also be needed if: ?You have a history of gestational diabetes. ?There is a history of giving birth to very large babies or of losing pregnancies (having stillbirths). ?You have signs and symptoms of diabetes, such as: ?Changes in your eyesight. ?Tingling or numbness in your hands or feet. ?Changes in hunger, thirst, and urination, and these are not explained by your pregnancy. ?What is being tested? ?This test measures the amount of glucose in your blood at different times during a period of 3 hours. This shows how well your body can process glucose. ?What kind of sample is taken? ?Blood samples are required for this test. They are usually collected by inserting a needle into a blood vessel. ?How do I prepare for this test? ?For 3 days before your test, eat normally. Have plenty of carbohydrate-rich foods. ?Follow instructions from your health care provider about: ?Eating or drinking restrictions on the day of the test. You may be asked not to eat or drink anything other than water (to fast) starting 8-10 hours before the test. ?Changing or stopping your regular medicines. Some medicines may interfere with this test. ?Tell a health care provider about: ?All medicines you are taking,  including vitamins, herbs, eye drops, creams, and over-the-counter medicines. ?Any blood disorders you have. ?Any surgeries you have had. ?Any medical conditions you have. ?What happens during the test? ?First, your blood glucose will be measured. This is referred to as your fasting blood glucose because you fasted before the test. Then, you will drink a glucose solution that contains a certain amount of glucose. Your blood glucose will be measured again 1, 2, and 3 hours after you drink the solution. ?This test takes about 3 hours to complete. You will need to stay at the testing location during this time. During the testing period: ?Do not eat or drink anything other than the glucose solution. ?Do not exercise. ?Do not use any products that contain nicotine or tobacco, such as cigarettes, e-cigarettes, and chewing tobacco. These can affect your test results. If you need help quitting, ask your health care provider. ?The testing procedure may vary among health care providers and hospitals. ?How are the results reported? ?Your results will be reported as milligrams of glucose per deciliter of blood (mg/dL) or millimoles per liter (mmol/L). There is more than one source for screening and diagnosis reference values used to diagnose gestational diabetes. Your health care provider will compare your results to normal values that were established after testing a large group of people (reference values). Reference values may vary among labs and hospitals. For this test (Carpenter-Coustan), reference values are: ?Fasting: 95 mg/dL (5.3 mmol/L). ?1 hour: 180 mg/dL (10.0 mmol/L). ?2 hour: 155 mg/dL (8.6 mmol/L). ?3 hour: 140 mg/dL (7.8 mmol/L). ?What do the results mean? ?Results below the reference values are considered  normal. If two or more of your blood glucose levels are at or above the reference values, you may be diagnosed with gestational diabetes. If only one level is high, your health care provider may suggest  repeat testing or other tests to confirm a diagnosis. ?Talk with your health care provider about what your results mean. ?Questions to ask your health care provider ?Ask your health care provider, or the department that is doing the test: ?When will my results be ready? ?How will I get my results? ?What are my treatment options? ?What other tests do I need? ?What are my next steps? ?Summary ?The oral glucose tolerance test (OGTT) is one of several tests used to diagnose diabetes that develops during pregnancy (gestational diabetes mellitus). Gestational diabetes is a short-term form of diabetes that some women develop while they are pregnant. ?You may have the OGTT test after having a 1-hour glucose screening test if the results from that test show that you may have gestational diabetes. You may also have this test if you have any symptoms or risk factors for this type of diabetes. ?Talk with your health care provider about what your results mean. ?This information is not intended to replace advice given to you by your health care provider. Make sure you discuss any questions you have with your health care provider. ?Document Revised: 12/15/2019 Document Reviewed: 12/15/2019 ?Elsevier Patient Education ? [employer]. ? ?

## 2021-10-02 NOTE — Progress Notes (Signed)
TRANSFER IN OB HISTORY AND PHYSICAL ? ?SUBJECTIVE:  ?    ? Jessica Keith is a 21 y.o. [id number] female, Patient's last menstrual period was 04/08/2021., Estimated Date of Delivery: 01/19/22, [redacted]w[redacted]d presents today for Transition of Prenatal Care. EPIC data migration from outside records is not accomplished today. Pt [place] [facility] [place] stating she did not like it there. She will sign release of medical records for genetic testing , labs, and [place]s [place] She states that her labs were all normal but that the u/s they did not get all the pictures of the heart that was needed.  ?Complaints today include: none  ? ?Relationship: single (FOB not involved) ?Living with Her mother ?Working [employer]?Exercise None ?Denies smoking, drugs and alcohol use.  ?  ?Gynecologic History ?Patient's last menstrual period was 04/08/2021. Normal ?Contraception: none ?Last Pap: n/a.  ? ?Obstetric History ?OB History  ?Gravida Para Term Preterm AB Living  ?1 0 0 0 0 0  ?SAB IAB Ectopic Multiple Live Births  ?0 0 0 0 0  ?  ?# Outcome Date GA Lbr Len/2nd Weight Sex Delivery Anes PTL Lv  ?1 Current           ? ? ?No past medical history on file. ? ?Past Surgical History:  ?Procedure Laterality Date  ? ADENOIDECTOMY    ? ? ?Current Outpatient Medications on File Prior to Visit  ?Medication Sig Dispense Refill  ? Doxylamine-Pyridoxine 10-10 MG TBEC Take 1 tablet by mouth 4 (four) times daily. Day 1 &2: 2 tablet at bedtimeDay 3 : if symptoms persists 1 tablet am; 2 tablet at bedtimeDay 4: 1 tablet am, 1 tab afternoon, 2 tab at bedtime 120 tablet 5  ? ondansetron (ZOFRAN ODT) 4 MG disintegrating tablet Take 1 tablet (4 mg total) by mouth every 8 (eight) hours as needed for nausea or vomiting. 20 tablet 0  ? Prenatal Vit-Fe Fumarate-FA (PRENATAL PO) Take 1 tablet by mouth daily.    ? ?No current facility-administered medications on file prior to visit.  ? ? ?No Known  Allergies ? ?Social History  ? ?Socioeconomic History  ? Marital status: Single  ?  Spouse name: Not on file  ? Number of children: Not on file  ? Years of education: Not on file  ? Highest education level: Not on file  ?Occupational History  ? Not on file  ?Tobacco Use  ? Smoking status: Never  ? Smokeless tobacco: Never  ?Vaping Use  ? Vaping Use: Some days  ?Substance and Sexual Activity  ? Alcohol use: No  ? Drug use: No  ? Sexual activity: Yes  ?  Partners: Male  ?  Birth control/protection: None  ?Other Topics Concern  ? Not on file  ?Social History Narrative  ? Not on file  ? ?Social Determinants of Health  ? ?Financial Resource Strain: Not on file  ?Food Insecurity: Not on file  ?Transportation Needs: Not on file  ?Physical Activity: Not on file  ?Stress: Not on file  ?Social Connections: Not on file  ?Intimate Partner Violence: Not on file  ? ? ?Family History  ?Problem Relation Age of Onset  ? Hypertension Mother   ? Hypertension Father   ? Cancer Maternal Grandfather   ?     prostate  ? ? ?The following portions of the patient's history were reviewed and updated as appropriate: allergies, current medications, past OB history, past medical history,  past surgical history, past family history, past social history, and problem list. ? ? ? ?OBJECTIVE: ?Initial Physical Exam (New OB) ? ?GENERAL APPEARANCE: alert, well appearing, in no apparent distress, oriented to person, place and time, overweight ?HEAD: normocephalic, atraumatic ?MOUTH: mucous membranes moist, pharynx normal without lesions ?THYROID: no thyromegaly or masses present ?BREASTS: no masses noted, no significant tenderness, no palpable axillary nodes, no skin changes ?LUNGS: clear to auscultation, no wheezes, rales or rhonchi, symmetric air entry ?HEART: regular rate and rhythm, no murmurs ?ABDOMEN: soft, nontender, nondistended, no abnormal masses, no epigastric pain ?EXTREMITIES: no redness or tenderness in the calves or thighs ?SKIN: normal  coloration and turgor, no rashes ?LYMPH NODES: no adenopathy palpable ?NEUROLOGIC: alert, oriented, normal speech, no focal findings or movement disorder noted ? ?PELVIC EXAM deferred ? ?ASSESSMENT: ?Normal pregnancy ? ?PLAN: ?New OB counseling: ?The patient has been given an overview regarding routine prenatal care. ?Recommendations regarding diet, weight gain, and exercise in pregnancy were given. Prenatal testing, optional genetic testing, carrier screening, and ultrasound use in pregnancy were reviewed.  Waiting for medical records.  ?Benefits of Breast Feeding were discussed. Discussed glucose screening next visit. Information sheet given. The patient is encouraged to consider nursing her baby post partum.  ? ?Follow up 4 wks for glucose screen and ROB with Missy.  ? ?Philip Aspen, CNM  ?

## 2021-10-18 ENCOUNTER — Encounter: Payer: Self-pay | Admitting: Certified Nurse Midwife

## 2021-10-19 ENCOUNTER — Telehealth: Payer: Medicaid Other | Admitting: Family Medicine

## 2021-10-19 DIAGNOSIS — R110 Nausea: Secondary | ICD-10-CM

## 2021-10-19 DIAGNOSIS — Z419 Encounter for procedure for purposes other than remedying health state, unspecified: Secondary | ICD-10-CM | POA: Diagnosis not present

## 2021-10-19 DIAGNOSIS — Z3A26 26 weeks gestation of pregnancy: Secondary | ICD-10-CM

## 2021-10-19 MED ORDER — ONDANSETRON HCL 4 MG PO TABS: 4.0000 mg | ORAL_TABLET | Freq: Three times a day (TID) | ORAL | 0 refills | Status: DC | PRN

## 2021-10-19 NOTE — Progress Notes (Signed)
?Virtual Visit Consent  ? ?Bonne Dolores, you are scheduled for a virtual visit with a [facility] provider today.   ?  ?Just as with appointments in the office, your consent must be obtained to participate.  Your consent will be active for this visit and any virtual visit you may have with one of our providers in the next 365 days.   ?  ?If you have a MyChart account, a copy of this consent can be sent to you electronically.  All virtual visits are billed to your insurance company just like a traditional visit in the office.   ? ?As this is a virtual visit, video technology does not allow for your provider to perform a traditional examination.  This may limit your provider's ability to fully assess your condition.  If your provider identifies any concerns that need to be evaluated in person or the need to arrange testing (such as labs, EKG, etc.), we will make arrangements to do so.   ?  ?Although advances in technology are sophisticated, we cannot ensure that it will always work on either your end or our end.  If the connection with a video visit is poor, the visit may have to be switched to a telephone visit.  With either a video or telephone visit, we are not always able to ensure that we have a secure connection.    ? ?I need to obtain your verbal consent now.   Are you willing to proceed with your visit today?  ?  ?Lavra Imler has provided verbal consent on 10/19/2021 for a virtual visit (video or telephone). ?  ?Dellia Nims, FNP  ? ?Date: 10/19/2021 11:29 AM ? ? ?Virtual Visit via Video Note  ? Ramon Dredge, connected with  Kymberli Wiegand  ([id number], Mar 29, 2001) on 10/19/21 at 11:15 AM EDT by a video-enabled telemedicine application and verified that I am speaking with the correct person using two identifiers. ? ?Location: ?Patient: Virtual Visit Location Patient: Home ?Provider: Virtual Visit Location Provider: Home Office ?  ?I discussed the limitations of evaluation and management by telemedicine  and the availability of in person appointments. The patient expressed understanding and agreed to proceed.   ? ?History of Present Illness: ?Jessica Keith is a 21 y.o. who identifies as a female who was assigned female at birth, and is being seen today for nausea. She was previously prescribed zofran and ran out. She is not vomiting. She is [redacted] weeks pregnant. No diarrhea, fever or urinary sx. . ? ?HPI: HPI  ?Problems:  ?Patient Active Problem List  ? Diagnosis Date Noted  ? Obesity (BMI 30-39.9) 09/17/2020  ? NEVUS, BENIGN 03/01/2010  ?  ?Allergies: No Known Allergies ?Medications:  ?Current Outpatient Medications:  ?  ondansetron (ZOFRAN) 4 MG tablet, Take 1 tablet (4 mg total) by mouth every 8 (eight) hours as needed for nausea or vomiting., Disp: 20 tablet, Rfl: 0 ?  Doxylamine-Pyridoxine 10-10 MG TBEC, Take 1 tablet by mouth 4 (four) times daily. Day 1 &2: 2 tablet at bedtimeDay 3 : if symptoms persists 1 tablet am; 2 tablet at bedtimeDay 4: 1 tablet am, 1 tab afternoon, 2 tab at bedtime, Disp: 120 tablet, Rfl: 5 ?  ondansetron (ZOFRAN ODT) 4 MG disintegrating tablet, Take 1 tablet (4 mg total) by mouth every 8 (eight) hours as needed for nausea or vomiting., Disp: 20 tablet, Rfl: 0 ?  Prenatal Vit-Fe Fumarate-FA (PRENATAL PO), Take 1 tablet by mouth daily., Disp: , Rfl:  ? ?Observations/Objective: ?  Patient is well-developed, well-nourished in no acute distress.  ?Resting comfortably  at home.  ?Head is normocephalic, atraumatic.  ?No labored breathing.  ?Speech is clear and coherent with logical content.  ?Patient is alert and oriented at baseline.  ? ? ?Assessment and Plan: ?1. Nausea ? ?2. [redacted] weeks gestation of pregnancy ? ?Increase fluids, med use and side effects discussed, proceed to ed for worsening sx. Follow up with OB this week.  ? ?Follow Up Instructions: ?I discussed the assessment and treatment plan with the patient. The patient was provided an opportunity to ask questions and all were answered.  The patient agreed with the plan and demonstrated an understanding of the instructions.  A copy of instructions were sent to the patient via MyChart unless otherwise noted below.  ? ? ? ?The patient was advised to call back or seek an in-person evaluation if the symptoms worsen or if the condition fails to improve as anticipated. ? ?Time:  ?I spent 8 minutes with the patient via telehealth technology discussing the above problems/concerns.   ? ?Dellia Nims, FNP ? ?

## 2021-10-19 NOTE — Patient Instructions (Signed)
Nausea, Adult ?Nausea is the feeling of having an upset stomach or that you are about to vomit. Nausea on its own is not usually a serious concern, but it may be an early sign of a more serious medical problem. As nausea gets worse, it can lead to vomiting. If vomiting develops, or if you are not able to drink enough fluids, you are at risk of becoming dehydrated. ?Dehydration can make you tired and thirsty, cause you to have a dry mouth, and decrease how often you urinate. Older adults and people with other diseases or a weak disease-fighting system (immune system) are at higher risk for dehydration. The main goals of treating your nausea are: ?To relieve your nausea. ?To limit repeated nausea episodes. ?To prevent vomiting and dehydration. ?Follow these instructions at home: ?Watch your symptoms for any changes. Tell your health care provider about them. ?Eating and drinking ?  ?Take an oral rehydration solution (ORS). This is a drink that is sold at pharmacies and retail stores. ?Drink clear fluids slowly and in small amounts as you are able. Clear fluids include water, ice chips, low-calorie sports drinks, and fruit juice that has water added (diluted fruit juice). ?Eat bland, easy-to-digest foods in small amounts as you are able. These foods include bananas, applesauce, rice, lean meats, toast, and crackers. ?Avoid drinking fluids that contain a lot of sugar or caffeine, such as energy drinks, sports drinks, and soda. ?Avoid alcohol. ?Avoid spicy or fatty foods. ?General instructions ?Take over-the-counter and prescription medicines only as told by your health care provider. ?Rest at home while you recover. ?Drink enough fluid to keep your urine pale yellow. ?Breathe slowly and deeply when you feel nauseous. ?Avoid smelling things that have strong odors. ?Wash your hands often using soap and water for at least 20 seconds. If soap and water are not available, use hand sanitizer. ?Make sure that everyone in your  household washes their hands well and often. ?Keep all follow-up visits. This is important. ?Contact a health care provider if: ?Your nausea gets worse. ?Your nausea does not go away after two days. ?You vomit multiple times. ?You cannot drink fluids without vomiting. ?You have any of the following: ?New symptoms. ?A fever. ?A headache. ?Muscle cramps. ?A rash. ?Pain while urinating. ?You feel light-headed or dizzy. ?Get help right away if: ?You have pain in your chest, neck, arm, or jaw. ?You feel extremely weak or you faint. ?You have vomit that is bright red or looks like coffee grounds. ?You have bloody or black stools (feces) or stools that look like tar. ?You have a severe headache, a stiff neck, or both. ?You have severe pain, cramping, or bloating in your abdomen. ?You have difficulty breathing or are breathing very quickly. ?Your heart is beating very quickly. ?Your skin feels cold and clammy. ?You feel confused. ?You have signs of dehydration, such as: ?Dark urine, very little urine, or no urine. ?Cracked lips. ?Dry mouth. ?Sunken eyes. ?Sleepiness. ?Weakness. ?These symptoms may be an emergency. Get help right away. Call 911. ?Do not wait to see if the symptoms will go away. ?Do not drive yourself to the hospital. ?Summary ?Nausea is the feeling that you have an upset stomach or that you are about to vomit. Nausea on its own is not usually a serious concern, but it may be an early sign of a more serious medical problem. ?If vomiting develops, or if you are not able to drink enough fluids, you are at risk of becoming dehydrated. ?Follow  recommendations for eating and drinking and take over-the-counter and prescription medicines only as told by your health care provider. ?Contact a health care provider right away if your symptoms worsen or you have new symptoms. ?Keep all follow-up visits. This is important. ?This information is not intended to replace advice given to you by your health care provider. Make  sure you discuss any questions you have with your health care provider. ?Document Revised: 01/11/2021 Document Reviewed: 01/11/2021 ?Elsevier Patient Education ? [place]. ? ?

## 2021-10-21 ENCOUNTER — Other Ambulatory Visit: Payer: Self-pay

## 2021-10-21 MED ORDER — ONDANSETRON HCL 4 MG PO TABS: 4.0000 mg | ORAL_TABLET | Freq: Three times a day (TID) | ORAL | 0 refills | Status: DC | PRN

## 2021-10-28 ENCOUNTER — Ambulatory Visit (INDEPENDENT_AMBULATORY_CARE_PROVIDER_SITE_OTHER): Payer: Medicaid Other | Admitting: Obstetrics

## 2021-10-28 ENCOUNTER — Other Ambulatory Visit: Payer: Medicaid Other

## 2021-10-28 ENCOUNTER — Encounter: Payer: Self-pay | Admitting: Obstetrics

## 2021-10-28 VITALS — BP 111/75 | HR 99 | Wt 220.6 lb

## 2021-10-28 DIAGNOSIS — Z0283 Encounter for blood-alcohol and blood-drug test: Secondary | ICD-10-CM

## 2021-10-28 DIAGNOSIS — Z113 Encounter for screening for infections with a predominantly sexual mode of transmission: Secondary | ICD-10-CM

## 2021-10-28 DIAGNOSIS — Z3A28 28 weeks gestation of pregnancy: Secondary | ICD-10-CM

## 2021-10-28 DIAGNOSIS — Z131 Encounter for screening for diabetes mellitus: Secondary | ICD-10-CM

## 2021-10-28 DIAGNOSIS — Z23 Encounter for immunization: Secondary | ICD-10-CM | POA: Diagnosis not present

## 2021-10-28 DIAGNOSIS — Z3403 Encounter for supervision of normal first pregnancy, third trimester: Secondary | ICD-10-CM

## 2021-10-28 LAB — POCT URINALYSIS DIPSTICK OB
Bilirubin, UA: NEGATIVE
Blood, UA: NEGATIVE
Glucose, UA: NEGATIVE
Ketones, UA: NEGATIVE
Leukocytes, UA: NEGATIVE
Nitrite, UA: NEGATIVE
Spec Grav, UA: 1.015 (ref 1.010–1.025)
Urobilinogen, UA: 0.2 E.U./dL
pH, UA: 8 (ref 5.0–8.0)

## 2021-10-28 NOTE — Progress Notes (Signed)
ROB at [redacted]w[redacted]d Good fetal movement. Having some round ligament pain with walking. Discussed comfort measures. Umbilical hernia noted by prior provider. Small diastasis recti palpated above umbilicus, no masses or tenderness felt. Recommend avoiding straining, heavy lifting. F/u if symptomatic. Discussed making a birth plan. RSB, TDaP, BTC done today. [place] consents to repeat drug screening. 1-hour glucose, RPR, CBC to be drawn. RTC in 2 weeks. ? ?MLloyd Huger CNM  ?

## 2021-10-29 ENCOUNTER — Other Ambulatory Visit: Payer: Self-pay | Admitting: Obstetrics

## 2021-10-29 ENCOUNTER — Encounter: Payer: Self-pay | Admitting: Obstetrics

## 2021-10-29 LAB — CBC WITH DIFFERENTIAL/PLATELET
Basophils Absolute: 0 10*3/uL (ref 0.0–0.2)
Basos: 0 %
EOS (ABSOLUTE): 0.1 10*3/uL (ref 0.0–0.4)
Eos: 1 %
Hematocrit: 32.3 % — ABNORMAL LOW (ref 34.0–46.6)
Hemoglobin: 10.5 g/dL — ABNORMAL LOW (ref 11.1–15.9)
Immature Grans (Abs): 0.1 10*3/uL (ref 0.0–0.1)
Immature Granulocytes: 1 %
Lymphocytes Absolute: 2.2 10*3/uL (ref 0.7–3.1)
Lymphs: 19 %
MCH: 28.6 pg (ref 26.6–33.0)
MCHC: 32.5 g/dL (ref 31.5–35.7)
MCV: 88 fL (ref 79–97)
Monocytes Absolute: 0.7 10*3/uL (ref 0.1–0.9)
Monocytes: 6 %
Neutrophils Absolute: 8.5 10*3/uL — ABNORMAL HIGH (ref 1.4–7.0)
Neutrophils: 73 %
Platelets: 275 10*3/uL (ref 150–450)
RBC: 3.67 x10E6/uL — ABNORMAL LOW (ref 3.77–5.28)
RDW: 12.2 % (ref 11.7–15.4)
WBC: 11.6 10*3/uL — ABNORMAL HIGH (ref 3.4–10.8)

## 2021-10-29 LAB — GLUCOSE TOLERANCE, 1 HOUR: Glucose, 1Hr PP: 80 mg/dL (ref 70–199)

## 2021-10-29 LAB — RPR: RPR Ser Ql: NONREACTIVE

## 2021-10-29 MED ORDER — FUSION PLUS PO CAPS: 1.0000 | ORAL_CAPSULE | Freq: Every day | ORAL | 1 refills | Status: DC

## 2021-10-30 ENCOUNTER — Other Ambulatory Visit: Payer: Self-pay | Admitting: Certified Nurse Midwife

## 2021-10-30 DIAGNOSIS — Z3A28 28 weeks gestation of pregnancy: Secondary | ICD-10-CM

## 2021-10-31 ENCOUNTER — Encounter: Payer: Medicaid Other | Admitting: Obstetrics

## 2021-10-31 ENCOUNTER — Other Ambulatory Visit: Payer: Medicaid Other

## 2021-11-01 ENCOUNTER — Other Ambulatory Visit: Payer: Self-pay | Admitting: Certified Nurse Midwife

## 2021-11-01 MED ORDER — ONDANSETRON HCL 4 MG PO TABS: 4.0000 mg | ORAL_TABLET | Freq: Three times a day (TID) | ORAL | 0 refills | Status: DC | PRN

## 2021-11-04 ENCOUNTER — Other Ambulatory Visit: Payer: Medicaid Other

## 2021-11-08 ENCOUNTER — Ambulatory Visit (INDEPENDENT_AMBULATORY_CARE_PROVIDER_SITE_OTHER): Payer: Medicaid Other

## 2021-11-08 DIAGNOSIS — Z3A28 28 weeks gestation of pregnancy: Secondary | ICD-10-CM

## 2021-11-08 DIAGNOSIS — Z3403 Encounter for supervision of normal first pregnancy, third trimester: Secondary | ICD-10-CM

## 2021-11-12 ENCOUNTER — Ambulatory Visit (INDEPENDENT_AMBULATORY_CARE_PROVIDER_SITE_OTHER): Payer: Medicaid Other | Admitting: Certified Nurse Midwife

## 2021-11-12 VITALS — BP 103/70 | HR 88 | Wt 220.0 lb

## 2021-11-12 DIAGNOSIS — Z3A30 30 weeks gestation of pregnancy: Secondary | ICD-10-CM

## 2021-11-12 DIAGNOSIS — Z3403 Encounter for supervision of normal first pregnancy, third trimester: Secondary | ICD-10-CM

## 2021-11-12 LAB — POCT URINALYSIS DIPSTICK OB
Bilirubin, UA: NEGATIVE
Blood, UA: NEGATIVE
Glucose, UA: NEGATIVE
Ketones, UA: NEGATIVE
Leukocytes, UA: NEGATIVE
Nitrite, UA: NEGATIVE
Spec Grav, UA: 1.015 (ref 1.010–1.025)
Urobilinogen, UA: 0.2 E.U./dL
pH, UA: 7 (ref 5.0–8.0)

## 2021-11-12 NOTE — Patient Instructions (Signed)
Round Ligament Pain  The round ligaments are a pair of cord-like tissues that help support the uterus. They can become a source of pain during pregnancy as the ligaments soften and stretch as the baby grows. The pain usually begins in the second trimester (13-28 weeks) of pregnancy, and should only last for a few seconds when it occurs. However, the pain can come and go until the baby is delivered. The pain does not cause harm to the baby. Round ligament pain is usually a short, sharp, and pinching pain, but it can also be a dull, lingering, and aching pain. The pain is felt in the lower side of the abdomen or in the groin. It usually starts deep in the groin and moves up to the outside of the hip area. The pain may happen when you: Suddenly change position, such as quickly going from a sitting to standing position. Do physical activity. Cough or sneeze. Follow these instructions at home: Managing pain  When the pain starts, relax. Then, try any of these methods to help with the pain: Sit down. Flex your knees up to your abdomen. Lie on your side with one pillow under your abdomen and another pillow between your legs. Sit in a warm bath for 15-20 minutes or until the pain goes away. General instructions Watch your condition for any changes. Move slowly when you sit down or stand up. Stop or reduce your physical activities if they cause pain. Avoid long walks if they cause pain. Take over-the-counter and prescription medicines only as told by your health care provider. Keep all follow-up visits. This is important. Contact a health care provider if: Your pain does not go away with treatment. You feel pain in your back that you did not have before. Your medicine is not helping. You have a fever or chills. You have nausea or vomiting. You have diarrhea. You have pain when you urinate. Get help right away if: You have pain that is a rhythmic, cramping pain similar to labor pains. Labor  pains are usually 2 minutes apart, last for about 1 minute, and involve a bearing down feeling or pressure in your pelvis. You have vaginal bleeding. These symptoms may represent a serious problem that is an emergency. Do not wait to see if the symptoms will go away. Get medical help right away. Call your local emergency services (911 in the [place] Do not drive yourself to the hospital. Summary Round ligament pain is felt in the lower abdomen or groin. This pain usually begins in the second trimester (13-28 weeks) and should only last for a few seconds when it occurs. You may notice the pain when you suddenly change position, when you cough or sneeze, or during physical activity. Relaxing, flexing your knees to your abdomen, lying on one side, or taking a warm bath may help to get rid of the pain. Contact your health care provider if the pain does not go away. This information is not intended to replace advice given to you by your health care provider. Make sure you discuss any questions you have with your health care provider. Document Revised: 09/19/2020 Document Reviewed: 09/19/2020 Elsevier Patient Education  2023 Elsevier Inc.  

## 2021-11-12 NOTE — Progress Notes (Signed)
ROB doing well, feeling good movement. She denies any complaints today. Discussed follow up u/s with MFM to get all of the pictures we need to complete the anatomy scan. She verbalizes understanding. Follow up 2 wks with Missy.  ? ?Philip Aspen, CNM  ?

## 2021-11-14 LAB — MONITOR DRUG PROFILE 14(MW)
Amphetamine Scrn, Ur: NEGATIVE ng/mL
BARBITURATE SCREEN URINE: NEGATIVE ng/mL
BENZODIAZEPINE SCREEN, URINE: NEGATIVE ng/mL
Buprenorphine, Urine: NEGATIVE ng/mL
CANNABINOIDS UR QL SCN: NEGATIVE ng/mL
Cocaine (Metab) Scrn, Ur: NEGATIVE ng/mL
Creatinine(Crt), U: 112.8 mg/dL (ref 20.0–300.0)
Fentanyl, Urine: NEGATIVE pg/mL
Meperidine Screen, Urine: NEGATIVE ng/mL
Methadone Screen, Urine: NEGATIVE ng/mL
OXYCODONE+OXYMORPHONE UR QL SCN: NEGATIVE ng/mL
Opiate Scrn, Ur: NEGATIVE ng/mL
Ph of Urine: 7.3 (ref 4.5–8.9)
Phencyclidine Qn, Ur: NEGATIVE ng/mL
Propoxyphene Scrn, Ur: NEGATIVE ng/mL
SPECIFIC GRAVITY: 1.034
Tramadol Screen, Urine: NEGATIVE ng/mL

## 2021-11-18 DIAGNOSIS — Z419 Encounter for procedure for purposes other than remedying health state, unspecified: Secondary | ICD-10-CM | POA: Diagnosis not present

## 2021-11-21 ENCOUNTER — Ambulatory Visit (INDEPENDENT_AMBULATORY_CARE_PROVIDER_SITE_OTHER): Payer: Medicaid Other | Admitting: Obstetrics

## 2021-11-21 VITALS — BP 114/77 | HR 118 | Wt 218.0 lb

## 2021-11-21 DIAGNOSIS — Z3403 Encounter for supervision of normal first pregnancy, third trimester: Secondary | ICD-10-CM

## 2021-11-21 DIAGNOSIS — Z3A31 31 weeks gestation of pregnancy: Secondary | ICD-10-CM

## 2021-11-21 LAB — POCT URINALYSIS DIPSTICK OB
Bilirubin, UA: NEGATIVE
Blood, UA: NEGATIVE
Glucose, UA: NEGATIVE
Ketones, UA: NEGATIVE
Leukocytes, UA: NEGATIVE
Nitrite, UA: NEGATIVE
Spec Grav, UA: 1.01 (ref 1.010–1.025)
Urobilinogen, UA: 0.2 E.U./dL
pH, UA: 7 (ref 5.0–8.0)

## 2021-11-21 MED ORDER — ONDANSETRON HCL 4 MG PO TABS: 4.0000 mg | ORAL_TABLET | Freq: Three times a day (TID) | ORAL | 0 refills | Status: DC | PRN

## 2021-11-21 NOTE — Progress Notes (Signed)
ROB at [redacted]w[redacted]d Active baby. Denies LOF, vaginal bleeding, ctx. Getting ready for baby at home. Her mom will help at labor, and she has good social support. Has noticed a little more bulging with her hernia. Nontender to palpation. Encouraged belly binder, mindful movements to avoid added pressure. Discussed comfort measures for sciatic pain. Has lost weight this pregnancy; poor appetite/nausea. Discussed small, frequent meals, protein shakes, calorie dense foods. Zofran refilled per request. Has f/u anatomy scan 12/12/21. Reviewed when to go to the hospital. RTC in 2 weeks. ? ?MLloyd Huger CNM ?

## 2021-12-02 ENCOUNTER — Ambulatory Visit (INDEPENDENT_AMBULATORY_CARE_PROVIDER_SITE_OTHER): Payer: Medicaid Other | Admitting: Obstetrics

## 2021-12-02 VITALS — BP 100/65 | HR 91 | Wt 221.4 lb

## 2021-12-02 DIAGNOSIS — Z3403 Encounter for supervision of normal first pregnancy, third trimester: Secondary | ICD-10-CM

## 2021-12-02 NOTE — Progress Notes (Signed)
ROB at [redacted]w[redacted]d Active baby. Denies ctx, LOF, vaginal bleeding. AKemariahas gained 3 lbs. since her last visit. She feels her appetite is still poor, but she is trying to eat more. She has noticed no changes in her hernia. Encouraged CBE. Pediatrician handout given. Reviewed GBS testing at 36 weeks and s/s of preterm labor. RTC in 2 weeks. ? ?MLloyd Huger CNM ?

## 2021-12-03 ENCOUNTER — Emergency Department: Payer: Medicaid Other

## 2021-12-03 ENCOUNTER — Other Ambulatory Visit: Payer: Self-pay

## 2021-12-03 ENCOUNTER — Encounter: Payer: Self-pay | Admitting: Emergency Medicine

## 2021-12-03 DIAGNOSIS — O26893 Other specified pregnancy related conditions, third trimester: Secondary | ICD-10-CM | POA: Diagnosis not present

## 2021-12-03 DIAGNOSIS — M79604 Pain in right leg: Secondary | ICD-10-CM | POA: Diagnosis not present

## 2021-12-03 DIAGNOSIS — R0602 Shortness of breath: Secondary | ICD-10-CM | POA: Insufficient documentation

## 2021-12-03 DIAGNOSIS — Z20822 Contact with and (suspected) exposure to covid-19: Secondary | ICD-10-CM | POA: Insufficient documentation

## 2021-12-03 DIAGNOSIS — Z3A33 33 weeks gestation of pregnancy: Secondary | ICD-10-CM | POA: Insufficient documentation

## 2021-12-03 DIAGNOSIS — O99413 Diseases of the circulatory system complicating pregnancy, third trimester: Secondary | ICD-10-CM | POA: Diagnosis not present

## 2021-12-03 DIAGNOSIS — R0789 Other chest pain: Secondary | ICD-10-CM | POA: Diagnosis not present

## 2021-12-03 DIAGNOSIS — R079 Chest pain, unspecified: Secondary | ICD-10-CM | POA: Diagnosis not present

## 2021-12-03 LAB — CBC
HCT: 33.3 % — ABNORMAL LOW (ref 36.0–46.0)
Hemoglobin: 10.9 g/dL — ABNORMAL LOW (ref 12.0–15.0)
MCH: 29.1 pg (ref 26.0–34.0)
MCHC: 32.7 g/dL (ref 30.0–36.0)
MCV: 89 fL (ref 80.0–100.0)
Platelets: 288 10*3/uL (ref 150–400)
RBC: 3.74 MIL/uL — ABNORMAL LOW (ref 3.87–5.11)
RDW: 13 % (ref 11.5–15.5)
WBC: 14.2 10*3/uL — ABNORMAL HIGH (ref 4.0–10.5)
nRBC: 0 % (ref 0.0–0.2)

## 2021-12-03 LAB — BASIC METABOLIC PANEL
Anion gap: 7 (ref 5–15)
BUN: 10 mg/dL (ref 6–20)
CO2: 19 mmol/L — ABNORMAL LOW (ref 22–32)
Calcium: 9.1 mg/dL (ref 8.9–10.3)
Chloride: 109 mmol/L (ref 98–111)
Creatinine, Ser: 0.62 mg/dL (ref 0.44–1.00)
GFR, Estimated: >60 mL/min (ref >60)
Glucose, Bld: 93 mg/dL (ref 70–99)
Potassium: 4.3 mmol/L (ref 3.5–5.1)
Sodium: 135 mmol/L (ref 135–145)

## 2021-12-03 LAB — TROPONIN I (HIGH SENSITIVITY): Troponin I (High Sensitivity): 4 ng/L (ref <18)

## 2021-12-03 NOTE — ED Triage Notes (Signed)
Pt to ED from home c/o mid chest pain that started when she woke up.  Pain is tightness in chest, SOB, nausea but also [redacted]wks pregnant.  Denies pregnancy complaints or complications, G1.  Denies cough.  Pt A&Ox4, chest rise even and unlabored, skin WNL and in NAD at this time.   ? ?Sees Philip Aspen with [facility] for OBGYN. ?

## 2021-12-03 NOTE — ED Notes (Signed)
Rings that patient left in Radiology handed over to triage RN. 3 rings returned.  ?

## 2021-12-04 ENCOUNTER — Emergency Department: Payer: Medicaid Other

## 2021-12-04 ENCOUNTER — Emergency Department
Admission: EM | Admit: 2021-12-04 | Discharge: 2021-12-04 | Disposition: A | Discharge status: Home / Self Care | Payer: Medicaid Other | Attending: Emergency Medicine | Admitting: Emergency Medicine

## 2021-12-04 DIAGNOSIS — Z3A33 33 weeks gestation of pregnancy: Secondary | ICD-10-CM | POA: Diagnosis not present

## 2021-12-04 DIAGNOSIS — R079 Chest pain, unspecified: Secondary | ICD-10-CM | POA: Diagnosis not present

## 2021-12-04 DIAGNOSIS — O99891 Other specified diseases and conditions complicating pregnancy: Secondary | ICD-10-CM

## 2021-12-04 DIAGNOSIS — R0602 Shortness of breath: Secondary | ICD-10-CM | POA: Diagnosis not present

## 2021-12-04 DIAGNOSIS — M79604 Pain in right leg: Secondary | ICD-10-CM | POA: Diagnosis not present

## 2021-12-04 HISTORY — DX: Umbilical hernia without obstruction or gangrene: K42.9

## 2021-12-04 LAB — RESP PANEL BY RT-PCR (FLU A&B, COVID) ARPGX2
Influenza A by PCR: NEGATIVE
Influenza B by PCR: NEGATIVE
SARS Coronavirus 2 by RT PCR: NEGATIVE

## 2021-12-04 LAB — TROPONIN I (HIGH SENSITIVITY): Troponin I (High Sensitivity): 5 ng/L (ref <18)

## 2021-12-04 LAB — D-DIMER, QUANTITATIVE: D-Dimer, Quant: 1.05 ug/mL-FEU — ABNORMAL HIGH (ref 0.00–0.50)

## 2021-12-04 LAB — HEPATIC FUNCTION PANEL
ALT: 19 U/L (ref 0–44)
AST: 17 U/L (ref 15–41)
Albumin: 3 g/dL — ABNORMAL LOW (ref 3.5–5.0)
Alkaline Phosphatase: 137 U/L — ABNORMAL HIGH (ref 38–126)
Bilirubin, Direct: 0.1 mg/dL (ref 0.0–0.2)
Indirect Bilirubin: 0.5 mg/dL (ref 0.3–0.9)
Total Bilirubin: 0.6 mg/dL (ref 0.3–1.2)
Total Protein: 7.1 g/dL (ref 6.5–8.1)

## 2021-12-04 LAB — BRAIN NATRIURETIC PEPTIDE: B Natriuretic Peptide: 53 pg/mL (ref 0.0–100.0)

## 2021-12-04 LAB — LIPASE, BLOOD: Lipase: 27 U/L (ref 11–51)

## 2021-12-04 NOTE — ED Notes (Signed)
Ultrasound at the bedside

## 2021-12-04 NOTE — Discharge Instructions (Signed)
As we discussed, we are unable to rule out a blood clot in your lungs as a possible cause of your chest pain or shortness of breath. A undiagnosed blood clot may lead to serious medical problems including death for both you and your baby. Therefore if your symptoms return and do not go away please return to the emergency room immediately for a CT scan.  You may also return at any time if you change your mind and would like to get the CT.  Otherwise follow-up with your primary care doctor in 2 days. ?

## 2021-12-04 NOTE — ED Provider Notes (Signed)
? ?[facility] ?Provider Note ? ? ? Event Date/Time  ? First MD Initiated Contact with Patient 12/04/21 0003   ?  (approximate) ? ? ?History  ? ?Chest Pain ? ? ?HPI ? ?Jessica Keith is a 21 y.o. female G1 P0 currently at [redacted] weeks gestational age presents for evaluation of chest pain and shortness of breath.  Patient reports that her symptoms have been intermittent for most of her pregnancy she attributed to being pregnant.  She said today she has had chest pain almost the whole day.  She describes as a tightness across her chest.  She also has had shortness of breath.  She reports that the chest pain and shortness of breath no necessarily happen at the same time.  She reports that her symptoms are worse when she lays down.  She does report some burning sensation in the back of her throat.  She denies cough, fever, chills, history of asthma, she is non-smoker.  She denies personal or family history of PE or DVT, recent travel immobilization, leg pain or swelling, hemoptysis, exogenous hormones.  She denies any chest pain or shortness of breath at this time. ?  ? ? ?Past Medical History:  ?Diagnosis Date  ? Umbilical hernia   ? ? ?Past Surgical History:  ?Procedure Laterality Date  ? ADENOIDECTOMY    ? ? ? ?Physical Exam  ? ?Triage Vital Signs: ?ED Triage Vitals  ?Enc Vitals Group  ?   BP 12/03/21 2238 104/65  ?   Pulse Rate 12/03/21 2238 72  ?   Resp 12/03/21 2238 18  ?   Temp 12/03/21 2238 98.2 ?F (36.8 ?C)  ?   Temp Source 12/03/21 2238 Oral  ?   SpO2 12/03/21 2238 99 %  ?   Weight 12/03/21 2236 221 lb (100.2 kg)  ?   Height 12/03/21 2236 '5\' 3"'$  (1.6 m)  ?   Head Circumference --   ?   Peak Flow --   ?   Pain Score 12/03/21 2236 7  ?   Pain Loc --   ?   Pain Edu? --   ?   Excl. in [place]? --   ? ? ?Most recent vital signs: ?Vitals:  ? 12/04/21 0100 12/04/21 0135  ?BP: 100/68 (!) 91/51  ?Pulse: 86 87  ?Resp: 18 15  ?Temp:    ?SpO2: 100% 100%  ? ? ? ?Constitutional: Alert and oriented. Well  appearing and in no apparent distress. ?HEENT: ?     Head: Normocephalic and atraumatic.    ?     Eyes: Conjunctivae are normal. Sclera is non-icteric.  ?     Mouth/Throat: Mucous membranes are moist.  ?     Neck: Supple with no signs of meningismus. ?Cardiovascular: Regular rate and rhythm. No murmurs, gallops, or rubs. 2+ symmetrical distal pulses are present in all extremities.  ?Respiratory: Normal respiratory effort. Lungs are clear to auscultation bilaterally.  ?Gastrointestinal: Gravid, non tender, and non distended with positive bowel sounds. No rebound or guarding. ?Genitourinary: No CVA tenderness. ?Musculoskeletal:  No edema, cyanosis, or erythema of extremities. ?Neurologic: Normal speech and language. Face is symmetric. Moving all extremities. No gross focal neurologic deficits are appreciated. ?Skin: Skin is warm, dry and intact. No rash noted. ?Psychiatric: Mood and affect are normal. Speech and behavior are normal. ? ?ED Results / Procedures / Treatments  ? ?Labs ?(all labs ordered are listed, but only abnormal results are displayed) ?Labs Reviewed  ?BASIC METABOLIC PANEL -  Abnormal; Notable for the following components:  ?    Result Value  ? CO2 19 (*)   ? All other components within normal limits  ?CBC - Abnormal; Notable for the following components:  ? WBC 14.2 (*)   ? RBC 3.74 (*)   ? Hemoglobin 10.9 (*)   ? HCT 33.3 (*)   ? All other components within normal limits  ?HEPATIC FUNCTION PANEL - Abnormal; Notable for the following components:  ? Albumin 3.0 (*)   ? Alkaline Phosphatase 137 (*)   ? All other components within normal limits  ?D-DIMER, QUANTITATIVE - Abnormal; Notable for the following components:  ? D-Dimer, Quant 1.05 (*)   ? All other components within normal limits  ?RESP PANEL BY RT-PCR (FLU A&B, COVID) [id number]  ?BRAIN NATRIURETIC PEPTIDE  ?LIPASE, BLOOD  ?TROPONIN I (HIGH SENSITIVITY)  ?TROPONIN I (HIGH SENSITIVITY)  ? ? ? ?EKG ? ?ED ECG REPORT ?I, Rudene Re, the  attending physician, personally viewed and interpreted this ECG. ? ?Sinus rhythm, Normal intervals, norma axis, no ST elevations or depressions unchanged when compared to prior from 2021 ? ?RADIOLOGY ?I, Rudene Re, attending MD, have personally viewed and interpreted the images obtained during this visit as below: ? ?Chest x-ray negative for pneumonia or edema ? ?Venous ultrasound Doppler negative ?___________________________________________________ ?Interpretation by Radiologist:  ?DG Chest 2 View ? ?Result Date: 12/03/2021 ?CLINICAL DATA:  Chest pain, [redacted] weeks pregnant EXAM: CHEST - 2 VIEW COMPARISON:  05/21/2020 FINDINGS: Lungs are clear.  No pleural effusion or pneumothorax. The heart is normal in size. Visualized osseous structures are within normal limits. IMPRESSION: Normal chest radiographs. Electronically Signed   By: Julian Hy M.D.   On: 12/03/2021 23:01  ? ?US Venous Img Lower Bilateral ? ?Result Date: 12/04/2021 ?CLINICAL DATA:  Chest pain and shortness of breath, [redacted] weeks pregnant with leg pain, initial encounter EXAM: BILATERAL LOWER EXTREMITY VENOUS DOPPLER ULTRASOUND TECHNIQUE: Gray-scale sonography with graded compression, as well as color Doppler and duplex ultrasound were performed to evaluate the lower extremity deep venous systems from the level of the common femoral vein and including the common femoral, femoral, profunda femoral, popliteal and calf veins including the posterior tibial, peroneal and gastrocnemius veins when visible. The superficial great saphenous vein was also interrogated. Spectral Doppler was utilized to evaluate flow at rest and with distal augmentation maneuvers in the common femoral, femoral and popliteal veins. COMPARISON:  None Available. FINDINGS: RIGHT LOWER EXTREMITY Common Femoral Vein: No evidence of thrombus. Normal compressibility, respiratory phasicity and response to augmentation. Saphenofemoral Junction: No evidence of thrombus. Normal  compressibility and flow on color Doppler imaging. Profunda Femoral Vein: No evidence of thrombus. Normal compressibility and flow on color Doppler imaging. Femoral Vein: No evidence of thrombus. Normal compressibility, respiratory phasicity and response to augmentation. Popliteal Vein: No evidence of thrombus. Normal compressibility, respiratory phasicity and response to augmentation. Calf Veins: No evidence of thrombus. Normal compressibility and flow on color Doppler imaging. Superficial Great Saphenous Vein: No evidence of thrombus. Normal compressibility. Venous Reflux:  None. Other Findings:  None. LEFT LOWER EXTREMITY Common Femoral Vein: No evidence of thrombus. Normal compressibility, respiratory phasicity and response to augmentation. Saphenofemoral Junction: No evidence of thrombus. Normal compressibility and flow on color Doppler imaging. Profunda Femoral Vein: No evidence of thrombus. Normal compressibility and flow on color Doppler imaging. Femoral Vein: No evidence of thrombus. Normal compressibility, respiratory phasicity and response to augmentation. Popliteal Vein: No evidence of thrombus. Normal compressibility, respiratory phasicity and response  to augmentation. Calf Veins: No evidence of thrombus. Normal compressibility and flow on color Doppler imaging. Superficial Great Saphenous Vein: No evidence of thrombus. Normal compressibility. Venous Reflux:  None. Other Findings:  None. IMPRESSION: No evidence of deep venous thrombosis in either lower extremity. Electronically Signed   By: Inez Catalina M.D.   On: 12/04/2021 01:32   ? ? ? ? ?PROCEDURES: ? ?Critical Care performed: No ? ?Procedures ? ? ? ?IMPRESSION / MDM / ASSESSMENT AND PLAN / ED COURSE  ?I reviewed the triage vital signs and the nursing notes. ? ? 21 y.o. female G1 P0 currently at [redacted] weeks gestational age presents for evaluation of chest pain and shortness of breath.  Patient with chest tightness, some shortness of breath, and also  reports a burning sensation in his throat.  Unclear how long this is all been going on but she says today felt worse.  On exam she is well-appearing with normal vital signs, she has normal work of breathing normal sats, her [employer]

## 2021-12-04 NOTE — ED Notes (Signed)
ED Provider at bedside.(Dr. Alfred Levins) ?

## 2021-12-10 ENCOUNTER — Other Ambulatory Visit: Payer: Self-pay

## 2021-12-10 ENCOUNTER — Encounter: Payer: Self-pay | Admitting: Certified Nurse Midwife

## 2021-12-10 MED ORDER — ONDANSETRON HCL 4 MG PO TABS: 4.0000 mg | ORAL_TABLET | Freq: Three times a day (TID) | ORAL | 0 refills | Status: DC | PRN

## 2021-12-12 ENCOUNTER — Other Ambulatory Visit: Payer: Self-pay

## 2021-12-12 ENCOUNTER — Ambulatory Visit: Payer: Medicaid Other | Attending: Obstetrics and Gynecology

## 2021-12-12 DIAGNOSIS — Z3A30 30 weeks gestation of pregnancy: Secondary | ICD-10-CM

## 2021-12-12 DIAGNOSIS — O99213 Obesity complicating pregnancy, third trimester: Secondary | ICD-10-CM | POA: Diagnosis not present

## 2021-12-12 DIAGNOSIS — Z363 Encounter for antenatal screening for malformations: Secondary | ICD-10-CM | POA: Diagnosis not present

## 2021-12-12 DIAGNOSIS — Z3A34 34 weeks gestation of pregnancy: Secondary | ICD-10-CM | POA: Diagnosis not present

## 2021-12-12 DIAGNOSIS — Z3403 Encounter for supervision of normal first pregnancy, third trimester: Secondary | ICD-10-CM

## 2021-12-18 ENCOUNTER — Encounter: Payer: Self-pay | Admitting: Certified Nurse Midwife

## 2021-12-18 ENCOUNTER — Ambulatory Visit (INDEPENDENT_AMBULATORY_CARE_PROVIDER_SITE_OTHER): Payer: Medicaid Other | Admitting: Certified Nurse Midwife

## 2021-12-18 VITALS — BP 105/73 | HR 93 | Wt 219.6 lb

## 2021-12-18 DIAGNOSIS — Z3A35 35 weeks gestation of pregnancy: Secondary | ICD-10-CM

## 2021-12-18 DIAGNOSIS — Z3483 Encounter for supervision of other normal pregnancy, third trimester: Secondary | ICD-10-CM

## 2021-12-18 LAB — POCT URINALYSIS DIPSTICK OB
Bilirubin, UA: NEGATIVE
Blood, UA: NEGATIVE
Glucose, UA: NEGATIVE
Ketones, UA: NEGATIVE
Leukocytes, UA: NEGATIVE
Nitrite, UA: NEGATIVE
POC,PROTEIN,UA: NEGATIVE
Spec Grav, UA: 1.01 (ref 1.010–1.025)
Urobilinogen, UA: 0.2 E.U./dL
pH, UA: 7.5 (ref 5.0–8.0)

## 2021-12-18 NOTE — Progress Notes (Signed)
ROB doing well, feeling good movement. Discussed GBS test next visit. She verbalizes and agrees. Reviewed PTL precautions [place]is [place]ux[place] precautions. She verbalizes and agrees. Follow up 1 wk for ROB.   Philip Aspen, CNM

## 2021-12-18 NOTE — Patient Instructions (Signed)

## 2021-12-19 DIAGNOSIS — Z419 Encounter for procedure for purposes other than remedying health state, unspecified: Secondary | ICD-10-CM | POA: Diagnosis not present

## 2021-12-26 ENCOUNTER — Ambulatory Visit (INDEPENDENT_AMBULATORY_CARE_PROVIDER_SITE_OTHER): Payer: Medicaid Other | Admitting: Obstetrics

## 2021-12-26 ENCOUNTER — Other Ambulatory Visit (HOSPITAL_COMMUNITY)
Admission: RE | Admit: 2021-12-26 | Discharge: 2021-12-26 | Disposition: A | Discharge status: Home / Self Care | Payer: Medicaid Other | Source: Ambulatory Visit | Attending: Obstetrics | Admitting: Obstetrics

## 2021-12-26 VITALS — BP 116/76 | HR 88 | Wt 224.8 lb

## 2021-12-26 DIAGNOSIS — Z3403 Encounter for supervision of normal first pregnancy, third trimester: Secondary | ICD-10-CM | POA: Diagnosis not present

## 2021-12-26 DIAGNOSIS — Z113 Encounter for screening for infections with a predominantly sexual mode of transmission: Secondary | ICD-10-CM

## 2021-12-26 DIAGNOSIS — Z3A36 36 weeks gestation of pregnancy: Secondary | ICD-10-CM

## 2021-12-26 LAB — POCT URINALYSIS DIPSTICK OB
Bilirubin, UA: NEGATIVE
Blood, UA: NEGATIVE
Glucose, UA: NEGATIVE
Ketones, UA: NEGATIVE
Leukocytes, UA: NEGATIVE
Nitrite, UA: NEGATIVE
POC,PROTEIN,UA: NEGATIVE
Spec Grav, UA: 1.01 (ref 1.010–1.025)
Urobilinogen, UA: 0.2 E.U./dL
pH, UA: 6 (ref 5.0–8.0)

## 2021-12-26 NOTE — Progress Notes (Signed)
ROB at [redacted]w[redacted]d Active baby. Denies LOF, vaginal bleeding. Had one strong contraction in the night and a few BH but nothing regular. Went to the ED recently for chest pain and SOB. Both have now resolved. Discussed when to go to the hospital. Having a baby shower and is working on getting everything ready. Baby is oblique today. Encouraged positioning to encourage rotation into pelvic. GBS and GC/chlamydia collected today. RTC in one week.  MLloyd Huger CNM

## 2021-12-30 ENCOUNTER — Encounter: Payer: Self-pay | Admitting: Obstetrics

## 2021-12-30 ENCOUNTER — Other Ambulatory Visit: Payer: Self-pay | Admitting: Certified Nurse Midwife

## 2021-12-30 LAB — CERVICOVAGINAL ANCILLARY ONLY
Chlamydia: NEGATIVE
Comment: NEGATIVE
Comment: NORMAL
Neisseria Gonorrhea: NEGATIVE

## 2021-12-30 LAB — CULTURE, BETA STREP (GROUP B ONLY): Strep Gp B Culture: NEGATIVE

## 2021-12-30 MED ORDER — ONDANSETRON HCL 4 MG PO TABS
4.0000 mg | ORAL_TABLET | Freq: Three times a day (TID) | ORAL | 0 refills | Status: DC | PRN
Start: 2021-12-30 — End: 2022-01-20

## 2022-01-07 ENCOUNTER — Encounter: Payer: Self-pay | Admitting: Obstetrics and Gynecology

## 2022-01-07 ENCOUNTER — Ambulatory Visit (INDEPENDENT_AMBULATORY_CARE_PROVIDER_SITE_OTHER): Payer: Medicaid Other | Admitting: Obstetrics and Gynecology

## 2022-01-07 VITALS — BP 108/76 | HR 93 | Wt 226.6 lb

## 2022-01-07 DIAGNOSIS — Z3A38 38 weeks gestation of pregnancy: Secondary | ICD-10-CM

## 2022-01-07 DIAGNOSIS — Z3483 Encounter for supervision of other normal pregnancy, third trimester: Secondary | ICD-10-CM

## 2022-01-07 LAB — POCT URINALYSIS DIPSTICK OB
Bilirubin, UA: NEGATIVE
Blood, UA: NEGATIVE
Glucose, UA: NEGATIVE
Ketones, UA: NEGATIVE
Leukocytes, UA: NEGATIVE
Nitrite, UA: NEGATIVE
POC,PROTEIN,UA: NEGATIVE
Spec Grav, UA: 1.015 (ref 1.010–1.025)
Urobilinogen, UA: 0.2 E.U./dL
pH, UA: 7 (ref 5.0–8.0)

## 2022-01-07 NOTE — Progress Notes (Signed)
ROB: Has occasional contractions but not regular.  Generally feels well.  Reports daily fetal movement.  Signs and symptoms of labor discussed.  Cervix not very dilated but head low and cervix very thin.

## 2022-01-07 NOTE — Progress Notes (Signed)
ROB. Patient states fetal movement with increased lower pressure. Patient states no questions or concerns at this time.

## 2022-01-15 ENCOUNTER — Inpatient Hospital Stay (HOSPITAL_COMMUNITY)
Admission: AD | Admit: 2022-01-15 | Discharge: 2022-01-15 | Disposition: A | Discharge status: Home / Self Care | Payer: Medicaid Other | Attending: Obstetrics & Gynecology | Admitting: Obstetrics & Gynecology

## 2022-01-15 ENCOUNTER — Other Ambulatory Visit: Payer: Self-pay

## 2022-01-15 ENCOUNTER — Encounter (HOSPITAL_COMMUNITY): Payer: Self-pay | Admitting: Obstetrics & Gynecology

## 2022-01-15 DIAGNOSIS — O471 False labor at or after 37 completed weeks of gestation: Secondary | ICD-10-CM | POA: Insufficient documentation

## 2022-01-15 DIAGNOSIS — Z3A39 39 weeks gestation of pregnancy: Secondary | ICD-10-CM | POA: Diagnosis not present

## 2022-01-15 NOTE — MAU Note (Signed)
Jessica Keith is a 22 y.o. at 64w3dhere in MAU reporting: started having contractions yesterday, today they are worse. They come every 7 minutes. No bleeding or LOF. +FM  Onset of complaint: yesterday  Pain score: 8/10  Vitals:   01/15/22 1813  BP: (!) 102/58  Pulse: 90  Resp: 16  Temp: 98.9 F (37.2 C)  SpO2: 99%     FHT:+FM, doppler deferred due to patient wearing a fitted dress  Lab orders placed from triage: none

## 2022-01-16 ENCOUNTER — Ambulatory Visit (INDEPENDENT_AMBULATORY_CARE_PROVIDER_SITE_OTHER): Payer: Medicaid Other | Admitting: Certified Nurse Midwife

## 2022-01-16 VITALS — BP 124/81 | HR 103 | Wt 227.1 lb

## 2022-01-16 DIAGNOSIS — Z3483 Encounter for supervision of other normal pregnancy, third trimester: Secondary | ICD-10-CM

## 2022-01-16 LAB — POCT URINALYSIS DIPSTICK OB
Bilirubin, UA: NEGATIVE
Blood, UA: NEGATIVE
Glucose, UA: NEGATIVE
Ketones, UA: NEGATIVE
Leukocytes, UA: NEGATIVE
Nitrite, UA: NEGATIVE
POC,PROTEIN,UA: NEGATIVE
Spec Grav, UA: 1.025 (ref 1.010–1.025)
Urobilinogen, UA: 0.2 E.U./dL
pH, UA: 7.5 (ref 5.0–8.0)

## 2022-01-16 NOTE — Patient Instructions (Signed)
Braxton Hicks Contractions  Contractions of the uterus can occur throughout pregnancy, but they are not always a sign that you are in labor. You may have practice contractions called Braxton Hicks contractions. These false labor contractions are sometimes confused with true labor. What are Braxton Hicks contractions? Braxton Hicks contractions are tightening movements that occur in the muscles of the uterus before labor. Unlike true labor contractions, these contractions do not result in opening (dilation) and thinning of the lowest part of the uterus (cervix). Toward the end of pregnancy (32-34 weeks), Braxton Hicks contractions can happen more often and may become stronger. These contractions are sometimes difficult to tell apart from true labor because they can be very uncomfortable. How to tell the difference between true labor and false labor True labor Contractions last 30-70 seconds. Contractions become very regular. Discomfort is usually felt in the top of the uterus, and it spreads to the lower abdomen and low back. Contractions do not go away with walking. Contractions usually become stronger and more frequent. The cervix dilates and gets thinner. False labor Contractions are usually shorter, weaker, and farther apart than true labor contractions. Contractions are usually irregular. Contractions are often felt in the front of the lower abdomen and in the groin. Contractions may go away when you walk around or change positions while lying down. The cervix usually does not dilate or become thin. Sometimes, the only way to tell if you are in true labor is for your health care provider to look for changes in your cervix. Your health care provider will do a physical exam and may monitor your contractions. If you are in true labor, your health care provider will send you home with instructions about when to return to the hospital. You may continue to have Braxton Hicks contractions until you  go into true labor. Follow these instructions at home:  Take over-the-counter and prescription medicines only as told by your health care provider. If Braxton Hicks contractions are making you uncomfortable: Change your position from lying down or resting to walking, or change from walking to resting. Sit and rest in a tub of warm water. Drink enough fluid to keep your urine pale yellow. Dehydration may cause these contractions. Do slow and deep breathing several times an hour. Keep all follow-up visits. This is important. Contact a health care provider if: You have a fever. You have continuous pain in your abdomen. Your contractions become stronger, more regular, and closer together. You pass blood-tinged mucus. Get help right away if: You have fluid leaking or gushing from your vagina. You have bright red blood coming from your vagina. Your baby is not moving inside you as much as it used to. Summary You may have practice contractions called Braxton Hicks contractions. These false labor contractions are sometimes confused with true labor. Braxton Hicks contractions are usually shorter, weaker, farther apart, and less regular than true labor contractions. True labor contractions usually become stronger, more regular, and more frequent. Manage discomfort from Braxton Hicks contractions by changing position, resting in a warm bath, practicing deep breathing, and drinking plenty of water. Keep all follow-up visits. Contact your health care provider if your contractions become stronger, more regular, and closer together. This information is not intended to replace advice given to you by your health care provider. Make sure you discuss any questions you have with your health care provider. Document Revised: 05/14/2020 Document Reviewed: 05/14/2020 Elsevier Patient Education  2023 [employer]  

## 2022-01-16 NOTE — Progress Notes (Signed)
ROB doing well, feeling good movement. Labor contractions reviewed. Discussed NST ROB 1 wk. She verbalizes and agree.   Philip Aspen, CNM

## 2022-01-17 ENCOUNTER — Other Ambulatory Visit: Payer: Self-pay

## 2022-01-17 ENCOUNTER — Encounter: Payer: Self-pay | Admitting: Obstetrics and Gynecology

## 2022-01-17 ENCOUNTER — Observation Stay (HOSPITAL_BASED_OUTPATIENT_CLINIC_OR_DEPARTMENT_OTHER)
Admission: EM | Admit: 2022-01-17 | Discharge: 2022-01-17 | Disposition: A | Discharge status: Home / Self Care | Payer: Medicaid Other | Source: Home / Self Care | Admitting: Certified Nurse Midwife

## 2022-01-17 DIAGNOSIS — R109 Unspecified abdominal pain: Secondary | ICD-10-CM

## 2022-01-17 DIAGNOSIS — Z79899 Other long term (current) drug therapy: Secondary | ICD-10-CM | POA: Insufficient documentation

## 2022-01-17 DIAGNOSIS — Z3A39 39 weeks gestation of pregnancy: Secondary | ICD-10-CM | POA: Diagnosis not present

## 2022-01-17 DIAGNOSIS — O26893 Other specified pregnancy related conditions, third trimester: Secondary | ICD-10-CM | POA: Diagnosis not present

## 2022-01-17 DIAGNOSIS — O471 False labor at or after 37 completed weeks of gestation: Secondary | ICD-10-CM | POA: Insufficient documentation

## 2022-01-17 HISTORY — DX: Other specified health status: Z78.9

## 2022-01-17 MED ORDER — ZOLPIDEM TARTRATE 5 MG PO TABS
5.0000 mg | ORAL_TABLET | Freq: Once | ORAL | Status: AC
  Administered 2022-01-17: 5 mg via ORAL

## 2022-01-17 MED ORDER — ZOLPIDEM TARTRATE 5 MG PO TABS
ORAL_TABLET | ORAL | Status: AC
  Filled 2022-01-17: qty 1

## 2022-01-17 MED ORDER — CALCIUM CARBONATE ANTACID 500 MG PO CHEW
1.0000 | CHEWABLE_TABLET | Freq: Three times a day (TID) | ORAL | Status: DC | PRN
  Administered 2022-01-17: 200 mg via ORAL
  Filled 2022-01-17: qty 1

## 2022-01-17 NOTE — Progress Notes (Signed)
   01/17/22 1855  Fetal Heart Rate A  Mode External  Baseline Rate (A) 135 bpm  Variability 6-25 BPM  Accelerations 15 x 15  Decelerations None  Multiple birth? N  Uterine Activity  Mode Toco  Contraction Frequency (min) 3-6  Contraction Duration (sec) 60-80  Contraction Quality Mild  Resting Tone Palpated Relaxed  Resting Time Adequate   NST reactive, verified with Philip Aspen, CNM. CNM states pt may come off monitor at this time. Pt removed from EFM at 1856.

## 2022-01-17 NOTE — OB Triage Note (Addendum)
    L&D OB Triage Note  SUBJECTIVE Jessica Keith is a 21 y.o. G1P0000 female at [redacted]w[redacted]d EDD Estimated Date of Delivery: 01/19/22 who presented to triage with complaints of contractions. She feels good movement, denies loss of fluid and vaginal bleeding.   OB History  Gravida Para Term Preterm AB Living  1 0 0 0 0 0  SAB IAB Ectopic Multiple Live Births  0 0 0 0 0    # Outcome Date GA Lbr Len/2nd Weight Sex Delivery Anes PTL Lv  1 Current             Medications Prior to Admission  Medication Sig Dispense Refill Last Dose   Iron-FA-B Cmp-C-Biot-Probiotic (FUSION PLUS) CAPS Take 1 tablet by mouth daily. 60 capsule 1    ondansetron (ZOFRAN) 4 MG tablet Take 1 tablet (4 mg total) by mouth every 8 (eight) hours as needed for nausea or vomiting. 20 tablet 0    Prenatal Vit-Fe Fumarate-FA (PRENATAL PO) Take 1 tablet by mouth daily.        OBJECTIVE  Nursing Evaluation:   BP 119/69 (BP Location: Right Arm)   Pulse 97   Temp 99 F (37.2 C) (Oral)   Resp 18   LMP 04/08/2021   SpO2 98%    Findings:        Reactive NST       NST was performed and has been reviewed by me.  NST INTERPRETATION: Category I  Mode: External Baseline Rate (A): 135 bpm Variability: Moderate Accelerations: 15 x 15 Decelerations: None     Contraction Frequency (min): 3-6  ASSESSMENT Impression:  1.  Pregnancy:  G1P0000 at 342w5d EDD Estimated Date of Delivery: 01/19/22 2.  Reassuring fetal and maternal status 3.  No cervical change 2 hrs   PLAN 1. Current condition and above findings reviewed.  Reassuring fetal and maternal condition. 2. Discharge home with standard labor precautions given to return to L&D or call the office for problems. 3. Continue routine prenatal care.   I was present and evaluated this patient in person.  AnPhilip AspenCNM

## 2022-01-17 NOTE — OB Triage Note (Addendum)
Pt presents with c/o ctx starting Tuesday every 10 minutes lasting for 1 minute per pt. Up until today pt states she has ctx every 5 minutes lasting 1 minute long. Pt states pt lost mucous plug starting Monday. Pt states ctx are 8/10 pain. Pt reports positive fetal movement. Pt states she ate hot dog a couple hours ago and water recently. Pt denies N/V/D, pt denies LOF and bleeding. Pt denies vaginal bleeding. Pt c/o heartburn. Pt placed on monitor and Vital signs obtained and WDL. Will continue to monitor.

## 2022-01-18 ENCOUNTER — Inpatient Hospital Stay: Payer: Medicaid Other | Admitting: Anesthesiology

## 2022-01-18 ENCOUNTER — Encounter: Payer: Self-pay | Admitting: Obstetrics and Gynecology

## 2022-01-18 ENCOUNTER — Observation Stay
Admission: EM | Admit: 2022-01-18 | Discharge: 2022-01-18 | Disposition: A | Discharge status: Home / Self Care | Payer: Medicaid Other | Source: Home / Self Care | Admitting: Certified Nurse Midwife

## 2022-01-18 ENCOUNTER — Inpatient Hospital Stay
Admission: EM | Admit: 2022-01-18 | Discharge: 2022-01-20 | DRG: 807 | Disposition: A | Discharge status: Home / Self Care | Payer: Medicaid Other | Attending: Certified Nurse Midwife | Admitting: Certified Nurse Midwife

## 2022-01-18 DIAGNOSIS — O99214 Obesity complicating childbirth: Secondary | ICD-10-CM | POA: Diagnosis not present

## 2022-01-18 DIAGNOSIS — O471 False labor at or after 37 completed weeks of gestation: Secondary | ICD-10-CM | POA: Insufficient documentation

## 2022-01-18 DIAGNOSIS — Z79899 Other long term (current) drug therapy: Secondary | ICD-10-CM | POA: Insufficient documentation

## 2022-01-18 DIAGNOSIS — Z3A39 39 weeks gestation of pregnancy: Secondary | ICD-10-CM | POA: Insufficient documentation

## 2022-01-18 DIAGNOSIS — Z3A40 40 weeks gestation of pregnancy: Secondary | ICD-10-CM | POA: Diagnosis not present

## 2022-01-18 DIAGNOSIS — Z23 Encounter for immunization: Secondary | ICD-10-CM

## 2022-01-18 DIAGNOSIS — R109 Unspecified abdominal pain: Secondary | ICD-10-CM | POA: Diagnosis not present

## 2022-01-18 DIAGNOSIS — O26893 Other specified pregnancy related conditions, third trimester: Secondary | ICD-10-CM | POA: Diagnosis not present

## 2022-01-18 DIAGNOSIS — Z30017 Encounter for initial prescription of implantable subdermal contraceptive: Secondary | ICD-10-CM | POA: Diagnosis not present

## 2022-01-18 DIAGNOSIS — Z419 Encounter for procedure for purposes other than remedying health state, unspecified: Secondary | ICD-10-CM | POA: Diagnosis not present

## 2022-01-18 DIAGNOSIS — O480 Post-term pregnancy: Secondary | ICD-10-CM | POA: Diagnosis not present

## 2022-01-18 LAB — CBC
HCT: 34.1 % — ABNORMAL LOW (ref 36.0–46.0)
Hemoglobin: 11.4 g/dL — ABNORMAL LOW (ref 12.0–15.0)
MCH: 28.2 pg (ref 26.0–34.0)
MCHC: 33.4 g/dL (ref 30.0–36.0)
MCV: 84.4 fL (ref 80.0–100.0)
Platelets: 274 10*3/uL (ref 150–400)
RBC: 4.04 MIL/uL (ref 3.87–5.11)
RDW: 13.6 % (ref 11.5–15.5)
WBC: 13.1 10*3/uL — ABNORMAL HIGH (ref 4.0–10.5)
nRBC: 0 % (ref 0.0–0.2)

## 2022-01-18 LAB — TYPE AND SCREEN
ABO/RH(D): O POS
Antibody Screen: NEGATIVE

## 2022-01-18 MED ORDER — MORPHINE SULFATE (PF) 4 MG/ML IV SOLN
2.5000 mg | Freq: Once | INTRAVENOUS | Status: AC
  Administered 2022-01-18: 2.5 mg via INTRAVENOUS
  Filled 2022-01-18: qty 1

## 2022-01-18 MED ORDER — ACETAMINOPHEN 325 MG PO TABS
650.0000 mg | ORAL_TABLET | ORAL | Status: DC | PRN
  Administered 2022-01-19: 650 mg via ORAL
  Filled 2022-01-18: qty 2

## 2022-01-18 MED ORDER — OXYTOCIN-SODIUM CHLORIDE 30-0.9 UT/500ML-% IV SOLN
1.0000 m[IU]/min | INTRAVENOUS | Status: DC
  Administered 2022-01-18: 2 m[IU]/min via INTRAVENOUS

## 2022-01-18 MED ORDER — SODIUM CHLORIDE 0.9 % IV SOLN
INTRAVENOUS | Status: DC | PRN
  Administered 2022-01-18: 8 mL via EPIDURAL

## 2022-01-18 MED ORDER — ONDANSETRON HCL 4 MG/2ML IJ SOLN
4.0000 mg | Freq: Four times a day (QID) | INTRAMUSCULAR | Status: DC | PRN
  Administered 2022-01-19: 4 mg via INTRAVENOUS
  Filled 2022-01-18: qty 2

## 2022-01-18 MED ORDER — OXYTOCIN BOLUS FROM INFUSION
333.0000 mL | Freq: Once | INTRAVENOUS | Status: AC
  Administered 2022-01-19: 333 mL via INTRAVENOUS

## 2022-01-18 MED ORDER — FENTANYL-BUPIVACAINE-NACL 0.5-0.125-0.9 MG/250ML-% EP SOLN
12.0000 mL/h | EPIDURAL | Status: DC | PRN
  Administered 2022-01-18: 12 mL/h via EPIDURAL

## 2022-01-18 MED ORDER — PHENYLEPHRINE 80 MCG/ML (10ML) SYRINGE FOR IV PUSH (FOR BLOOD PRESSURE SUPPORT): 80.0000 ug | PREFILLED_SYRINGE | INTRAVENOUS | Status: DC | PRN

## 2022-01-18 MED ORDER — EPHEDRINE 5 MG/ML INJ: 10.0000 mg | INTRAVENOUS | Status: DC | PRN

## 2022-01-18 MED ORDER — MORPHINE SULFATE (PF) 4 MG/ML IV SOLN
5.0000 mg | Freq: Once | INTRAVENOUS | Status: AC
  Administered 2022-01-18: 5 mg via INTRAMUSCULAR
  Filled 2022-01-18: qty 2

## 2022-01-18 MED ORDER — PHENYLEPHRINE 80 MCG/ML (10ML) SYRINGE FOR IV PUSH (FOR BLOOD PRESSURE SUPPORT)
80.0000 ug | PREFILLED_SYRINGE | INTRAVENOUS | Status: DC | PRN
Start: 2022-01-18 — End: 2022-01-20

## 2022-01-18 MED ORDER — FENTANYL-BUPIVACAINE-NACL 0.5-0.125-0.9 MG/250ML-% EP SOLN
EPIDURAL | Status: AC
  Filled 2022-01-18: qty 250

## 2022-01-18 MED ORDER — LIDOCAINE HCL (PF) 1 % IJ SOLN
30.0000 mL | INTRAMUSCULAR | Status: DC | PRN
  Filled 2022-01-18: qty 30

## 2022-01-18 MED ORDER — LACTATED RINGERS IV SOLN: 500.0000 mL | Freq: Once | INTRAVENOUS | Status: DC

## 2022-01-18 MED ORDER — DIPHENHYDRAMINE HCL 50 MG/ML IJ SOLN: 12.5000 mg | INTRAMUSCULAR | Status: DC | PRN

## 2022-01-18 MED ORDER — SODIUM CHLORIDE 0.9 % IV SOLN
12.5000 mg | Freq: Once | INTRAVENOUS | Status: AC
  Administered 2022-01-18: 12.5 mg via INTRAVENOUS
  Filled 2022-01-18: qty 0.5

## 2022-01-18 MED ORDER — LIDOCAINE HCL (PF) 1 % IJ SOLN
INTRAMUSCULAR | Status: DC | PRN
  Administered 2022-01-18: 3 mL via SUBCUTANEOUS

## 2022-01-18 MED ORDER — LIDOCAINE-EPINEPHRINE (PF) 1.5 %-1:200000 IJ SOLN
INTRAMUSCULAR | Status: DC | PRN
  Administered 2022-01-18: 3 mL via EPIDURAL

## 2022-01-18 MED ORDER — OXYTOCIN-SODIUM CHLORIDE 30-0.9 UT/500ML-% IV SOLN
2.5000 [IU]/h | INTRAVENOUS | Status: DC
  Administered 2022-01-19: 2.5 [IU]/h via INTRAVENOUS
  Filled 2022-01-18: qty 500

## 2022-01-18 MED ORDER — LACTATED RINGERS IV SOLN: INTRAVENOUS | Status: DC

## 2022-01-18 MED ORDER — TERBUTALINE SULFATE 1 MG/ML IJ SOLN: 0.2500 mg | Freq: Once | INTRAMUSCULAR | Status: DC | PRN

## 2022-01-18 MED ORDER — LACTATED RINGERS IV SOLN
500.0000 mL | INTRAVENOUS | Status: DC | PRN
  Administered 2022-01-18: 500 mL via INTRAVENOUS

## 2022-01-18 NOTE — OB Triage Note (Signed)
Pt is a G1P0 at 39.6 weeks.  She has been seen several times in the last 24 hours, most recently admitted overnight for therapeutic rest for 8 hours with change of 1 cm.

## 2022-01-18 NOTE — OB Triage Note (Signed)
    L&D OB Triage Note  SUBJECTIVE Jessica Keith is a 21 y.o. G1P0000 female at [redacted]w[redacted]d EDD Estimated Date of Delivery: 01/19/22 who presented to triage with complaints of contractions. Pt was seen in ob triage then discharged , then returned for ctx. She states they are stronger and closer together. She feels good movement, denies loss of fluid and vaginal bleeding.   OB History  Gravida Para Term Preterm AB Living  1 0 0 0 0 0  SAB IAB Ectopic Multiple Live Births  0 0 0 0 0    # Outcome Date GA Lbr Len/2nd Weight Sex Delivery Anes PTL Lv  1 Current             Medications Prior to Admission  Medication Sig Dispense Refill Last Dose   Iron-FA-B Cmp-C-Biot-Probiotic (FUSION PLUS) CAPS Take 1 tablet by mouth daily. 60 capsule 1 01/17/2022   ondansetron (ZOFRAN) 4 MG tablet Take 1 tablet (4 mg total) by mouth every 8 (eight) hours as needed for nausea or vomiting. 20 tablet 0 01/17/2022   Prenatal Vit-Fe Fumarate-FA (PRENATAL PO) Take 1 tablet by mouth daily.   01/17/2022     OBJECTIVE  Nursing Evaluation:   BP 118/71 (BP Location: Left Arm)   Pulse 72   Temp 98.6 F (37 C) (Oral)   Resp 20   Ht '5\' 3"'$  (1.6 m)   Wt 103 kg   LMP 04/08/2021   BMI 40.23 kg/m    Findings:         latent labor, therapeutic rest given with morphine and phenergan.      NST was performed and has been reviewed by me.  NST INTERPRETATION: Category I  Mode: External Baseline Rate (A): 120 bpm Variability: Moderate Accelerations: 15 x 15 Decelerations: None     Contraction Frequency (min): 2-4  ASSESSMENT Impression:  1.  Pregnancy:  G1P0000 at [redacted]w[redacted]d EDD Estimated Date of Delivery: 01/19/22 2.  Reassuring fetal and maternal status 3.  Minimal cervical change 1 cm in 8 hrs.   PLAN 1. Current condition and above findings reviewed.  Reassuring fetal and maternal condition. 2. Discharge home with standard labor precautions given to return to L&D or call the office for problems. 3. Continue  routine prenatal care.  AnPhilip AspenCNM

## 2022-01-18 NOTE — Anesthesia Preprocedure Evaluation (Signed)
Anesthesia Evaluation  Patient identified by MRN, date of birth, ID band Patient awake    Reviewed: Allergy & Precautions, NPO status , Patient's Chart, lab work & pertinent test results  History of Anesthesia Complications Negative for: history of anesthetic complications  Airway Mallampati: II  TM Distance: >3 FB Neck ROM: Full    Dental no notable dental hx. (+) Teeth Intact   Pulmonary neg pulmonary ROS, neg sleep apnea, neg COPD, Patient abstained from smoking.Not current smoker,    Pulmonary exam normal breath sounds clear to auscultation       Cardiovascular Exercise Tolerance: Good METS(-) hypertension(-) CAD and (-) Past MI negative cardio ROS  (-) dysrhythmias  Rhythm:Regular Rate:Normal - Systolic murmurs    Neuro/Psych negative neurological ROS  negative psych ROS   GI/Hepatic neg GERD  ,(+)     (-) substance abuse  ,   Endo/Other  neg diabetesMorbid obesity  Renal/GU negative Renal ROS     Musculoskeletal   Abdominal   Peds  Hematology   Anesthesia Other Findings Past Medical History: No date: Medical history non-contributory No date: Umbilical hernia  Reproductive/Obstetrics (+) Pregnancy                             Anesthesia Physical Anesthesia Plan  ASA: 3  Anesthesia Plan: Epidural   Post-op Pain Management:    Induction:   PONV Risk Score and Plan: 2 and Treatment may vary due to age or medical condition and Ondansetron  Airway Management Planned: Natural Airway  Additional Equipment:   Intra-op Plan:   Post-operative Plan:   Informed Consent: I have reviewed the patients History and Physical, chart, labs and discussed the procedure including the risks, benefits and alternatives for the proposed anesthesia with the patient or authorized representative who has indicated his/her understanding and acceptance.       Plan Discussed with:  Surgeon  Anesthesia Plan Comments: (Discussed R/B/A of neuraxial anesthesia technique with patient: - rare risks of spinal/epidural hematoma, nerve damage, infection - Risk of PDPH - Risk of itching - Risk of nausea and vomiting - Risk of poor block necessitating replacement of epidural. - Risk of allergic reactions. Patient voiced understanding.)        Anesthesia Quick Evaluation

## 2022-01-18 NOTE — Anesthesia Procedure Notes (Signed)
Epidural Patient location during procedure: OB  Staffing Anesthesiologist: Arita Miss, MD Performed: anesthesiologist   Preanesthetic Checklist Completed: patient identified, IV checked, site marked, risks and benefits discussed, surgical consent, monitors and equipment checked, pre-op evaluation and timeout performed  Epidural Patient position: sitting Prep: ChloraPrep Patient monitoring: heart rate, continuous pulse ox and blood pressure Approach: midline Location: L3-L4 Injection technique: LOR saline  Needle:  Needle type: Tuohy  Needle gauge: 17 G Needle length: 9 cm Needle insertion depth: 7 cm Catheter type: closed end flexible Catheter size: 19 Gauge Catheter at skin depth: 12 cm Test dose: negative and 1.5% lidocaine with Epi 1:200 K  Assessment Sensory level: T10 Events: blood not aspirated, injection not painful, no injection resistance, no paresthesia and negative IV test  Additional Notes one attempt Pt. Evaluated and documentation done after procedure finished. Patient identified. Risks/Benefits/Options discussed with patient including but not limited to bleeding, infection, nerve damage, paralysis, failed block, incomplete pain control, headache, blood pressure changes, nausea, vomiting, reactions to medication both or allergic, itching and postpartum back pain. Confirmed with bedside nurse the patient's most recent platelet count. Confirmed with patient that they are not currently taking any anticoagulation, have any bleeding history or any family history of bleeding disorders. Patient expressed understanding and wished to proceed. All questions were answered. Sterile technique was used throughout the entire procedure. Please see nursing notes for vital signs. Test dose was given through epidural catheter and negative prior to continuing to dose epidural or start infusion. Warning signs of high block given to the patient including shortness of breath,  tingling/numbness in hands, complete motor block, or any concerning symptoms with instructions to call for help. Patient was given instructions on fall risk and not to get out of bed. All questions and concerns addressed with instructions to call with any issues or inadequate analgesia.     Patient tolerated the insertion well without immediate complications.  Reason for block: procedure for painReason for block:procedure for pain

## 2022-01-18 NOTE — Progress Notes (Signed)
LABOR NOTE   Jessica Keith 21 y.o.GP@ at [redacted]w[redacted]d SUBJECTIVE:  Comfortable with epidural  Analgesia: Epidural  OBJECTIVE:  BP 97/73   Pulse 93   Temp 99.1 F (37.3 C) (Oral)   Resp 18   Ht '5\' 3"'$  (1.6 m)   Wt 103.4 kg   LMP 04/08/2021   SpO2 99%   BMI 40.39 kg/m  No intake/output data recorded.  She has shown cervical change. CERVIX: 7cm:  100%:   -2:   mid position:   soft SVE:   Dilation: 7 Effacement (%): 100 Station: -2 Exam by:: APhilip AspenCNM CONTRACTIONS: regular, every 2-3 minutes FHR: Fetal heart tracing reviewed. Baseline: 130 bpm, Variability: Good {> 6 bpm), Accelerations: Reactive, and Decelerations: Absent Category I    Labs: Lab Results  Component Value Date   WBC 13.1 (H) 01/18/2022   HGB 11.4 (L) 01/18/2022   HCT 34.1 (L) 01/18/2022   MCV 84.4 01/18/2022   PLT 274 01/18/2022    ASSESSMENT: 1) Labor curve reviewed.       Progress: Active phase labor.     Membranes: ruptured, meconium light   Principal Problem:   Labor and delivery, indication for care   PLAN: continue present management  APhilip Aspen CNM  01/18/2022 9:26 PM

## 2022-01-18 NOTE — H&P (Signed)
     History and Physical   HPI  Jessica Keith is a 21 y.o. G1P0000 at 13w6dEstimated Date of Delivery: 01/19/22 who is being admitted for labor management   OB History  OB History  Gravida Para Term Preterm AB Living  1 0 0 0 0 0  SAB IAB Ectopic Multiple Live Births  0 0 0 0 0    # Outcome Date GA Lbr Len/2nd Weight Sex Delivery Anes PTL Lv  1 Current             PROBLEM LIST  Pregnancy complications or risks: Patient Active Problem List   Diagnosis Date Noted   Labor and delivery, indication for care 01/17/2022   Obesity (BMI 30-39.9) 09/17/2020   NEVUS, BENIGN 03/01/2010    Prenatal labs and studies: ABO, Rh: O/Positive/-- (11/28 1110) Antibody: Negative (11/28 1110) Rubella: 1.73 (11/28 1110) RPR: Non Reactive (04/10 1030)  HBsAg: Negative (11/28 1110)  HIV: Non Reactive (11/28 1110)  GRXV:QMGQQPYP/- (06/08 1017)   Past Medical History:  Diagnosis Date   Medical history non-contributory    Umbilical hernia      Past Surgical History:  Procedure Laterality Date   ADENOIDECTOMY       Medications    Current Discharge Medication List     CONTINUE these medications which have NOT CHANGED   Details  Iron-FA-B Cmp-C-Biot-Probiotic (FUSION PLUS) CAPS Take 1 tablet by mouth daily. Qty: 60 capsule, Refills: 1    ondansetron (ZOFRAN) 4 MG tablet Take 1 tablet (4 mg total) by mouth every 8 (eight) hours as needed for nausea or vomiting. Qty: 20 tablet, Refills: 0    Prenatal Vit-Fe Fumarate-FA (PRENATAL PO) Take 1 tablet by mouth daily.         Allergies  Patient has no known allergies.  Review of Systems  Constitutional: negative Eyes: negative Ears, nose, mouth, throat, and face: negative Respiratory: negative Cardiovascular: negative Gastrointestinal: negative Genitourinary:negative Integument/breast: negative Hematologic/lymphatic: negative Musculoskeletal:negative Neurological: negative Behavioral/Psych: negative Endocrine:  negative Allergic/Immunologic: negative  Physical Exam  LMP 04/08/2021   Lungs:  CTA B Cardio: RRR without M/R/G Abd: Soft, gravid, NT Presentation: cephalic EXT: No C/C/ 1+ Edema DTRs: 2+ B CERVIX: Dilation: 6 Effacement (%): 100 Cervical Position: Middle Station: -2 Presentation: Vertex Exam by:: CNM  See Prenatal records for more detailed PE.     FHR:  Baseline: 140 bpm, Variability: Good {> 6 bpm), Accelerations: Reactive, and Decelerations: Absent  Toco: Uterine Contractions: Frequency: Every 2-5 minutes ( per pt)  Test Results  No results found for this or any previous visit (from the past 24 hour(s)). Group B Strep negative  Assessment   G1P0000 at 36w6dstimated Date of Delivery: 01/19/22  The fetus is reassuring.   Patient Active Problem List   Diagnosis Date Noted   Labor and delivery, indication for care 01/17/2022   Obesity (BMI 30-39.9) 09/17/2020   NEVUS, BENIGN 03/01/2010    Plan  1. Admit to L&D :   2. EFM:-- Category 1 3. IV pain medication/nitrous gas,  or Epidural if desired.   4. Admission labs  5. Anticipate NSVD 6. Dr. ChMarcelline Matesotified of admission and plan of care  AnPhilip AspenCNLoveland Surgery Center7/07/2021 6:42 PM

## 2022-01-18 NOTE — OB Triage Note (Signed)
Jessica Keith 21 y.o. presents to Labor & Delivery triage via wheelchair steered by ED staff reporting contractions every 3 minutes. She is a G1P0000 at [redacted]w[redacted]d. She denies signs and symptoms consistent with rupture of membranes or active vaginal bleeding. She endorses positive fetal movement. External FM and TOCO applied to non-tender abdomen. Initial FHR 150. Vital signs obtained and within normal limits. Patient oriented to care environment including call bell and bed control use. APhilip Aspen CNM notified of patient's arrival.

## 2022-01-19 ENCOUNTER — Encounter: Payer: Self-pay | Admitting: Certified Nurse Midwife

## 2022-01-19 ENCOUNTER — Inpatient Hospital Stay (HOSPITAL_COMMUNITY): Admit: 2022-01-19 | Payer: Self-pay

## 2022-01-19 DIAGNOSIS — Z3A40 40 weeks gestation of pregnancy: Secondary | ICD-10-CM

## 2022-01-19 DIAGNOSIS — O480 Post-term pregnancy: Secondary | ICD-10-CM

## 2022-01-19 LAB — CBC
HCT: 27.1 % — ABNORMAL LOW (ref 36.0–46.0)
Hemoglobin: 9 g/dL — ABNORMAL LOW (ref 12.0–15.0)
MCH: 28 pg (ref 26.0–34.0)
MCHC: 33.2 g/dL (ref 30.0–36.0)
MCV: 84.4 fL (ref 80.0–100.0)
Platelets: 253 10*3/uL (ref 150–400)
RBC: 3.21 MIL/uL — ABNORMAL LOW (ref 3.87–5.11)
RDW: 13.6 % (ref 11.5–15.5)
WBC: 16.1 10*3/uL — ABNORMAL HIGH (ref 4.0–10.5)
nRBC: 0 % (ref 0.0–0.2)

## 2022-01-19 MED ORDER — FERROUS SULFATE 325 (65 FE) MG PO TABS
325.0000 mg | ORAL_TABLET | Freq: Every day | ORAL | Status: DC
  Administered 2022-01-19 – 2022-01-20 (×2): 325 mg via ORAL
  Filled 2022-01-19 (×2): qty 1

## 2022-01-19 MED ORDER — IBUPROFEN 600 MG PO TABS
600.0000 mg | ORAL_TABLET | Freq: Four times a day (QID) | ORAL | Status: DC
  Administered 2022-01-19 – 2022-01-20 (×3): 600 mg via ORAL
  Filled 2022-01-19 (×3): qty 1

## 2022-01-19 MED ORDER — WITCH HAZEL-GLYCERIN EX PADS
1.0000 | MEDICATED_PAD | CUTANEOUS | Status: DC | PRN
  Administered 2022-01-19: 1 via TOPICAL
  Filled 2022-01-19: qty 100

## 2022-01-19 MED ORDER — ETONOGESTREL 68 MG ~~LOC~~ IMPL
68.0000 mg | DRUG_IMPLANT | Freq: Once | SUBCUTANEOUS | Status: AC
  Administered 2022-01-20: 68 mg via SUBCUTANEOUS
  Filled 2022-01-19: qty 1

## 2022-01-19 MED ORDER — TETANUS-DIPHTH-ACELL PERTUSSIS 5-2.5-18.5 LF-MCG/0.5 IM SUSY
0.5000 mL | PREFILLED_SYRINGE | Freq: Once | INTRAMUSCULAR | Status: DC
  Filled 2022-01-19: qty 0.5

## 2022-01-19 MED ORDER — OXYCODONE-ACETAMINOPHEN 5-325 MG PO TABS: 2.0000 | ORAL_TABLET | ORAL | Status: DC | PRN

## 2022-01-19 MED ORDER — BENZOCAINE-MENTHOL 20-0.5 % EX AERO
1.0000 | INHALATION_SPRAY | CUTANEOUS | Status: DC | PRN
  Administered 2022-01-19: 1 via TOPICAL
  Filled 2022-01-19: qty 56

## 2022-01-19 MED ORDER — METHYLERGONOVINE MALEATE 0.2 MG/ML IJ SOLN: 0.2000 mg | INTRAMUSCULAR | Status: DC | PRN

## 2022-01-19 MED ORDER — MEASLES, MUMPS & RUBELLA VAC IJ SOLR
0.5000 mL | Freq: Once | INTRAMUSCULAR | Status: DC
  Filled 2022-01-19: qty 0.5

## 2022-01-19 MED ORDER — ONDANSETRON HCL 4 MG/2ML IJ SOLN: 4.0000 mg | INTRAMUSCULAR | Status: DC | PRN

## 2022-01-19 MED ORDER — SIMETHICONE 80 MG PO CHEW
80.0000 mg | CHEWABLE_TABLET | ORAL | Status: DC | PRN
  Administered 2022-01-19: 80 mg via ORAL
  Filled 2022-01-19: qty 1

## 2022-01-19 MED ORDER — OXYCODONE-ACETAMINOPHEN 5-325 MG PO TABS: 1.0000 | ORAL_TABLET | ORAL | Status: DC | PRN

## 2022-01-19 MED ORDER — PRENATAL MULTIVITAMIN CH
1.0000 | ORAL_TABLET | Freq: Every day | ORAL | Status: DC
  Administered 2022-01-19 – 2022-01-20 (×2): 1 via ORAL
  Filled 2022-01-19 (×2): qty 1

## 2022-01-19 MED ORDER — DIBUCAINE (PERIANAL) 1 % EX OINT
1.0000 | TOPICAL_OINTMENT | CUTANEOUS | Status: DC | PRN
  Filled 2022-01-19: qty 28

## 2022-01-19 MED ORDER — ONDANSETRON HCL 4 MG PO TABS: 4.0000 mg | ORAL_TABLET | ORAL | Status: DC | PRN

## 2022-01-19 MED ORDER — LIDOCAINE HCL 1 % IJ SOLN
0.0000 mL | Freq: Once | INTRAMUSCULAR | Status: AC | PRN
  Administered 2022-01-20: 2 mL via INTRADERMAL
  Filled 2022-01-19: qty 20

## 2022-01-19 MED ORDER — DOCUSATE SODIUM 100 MG PO CAPS
100.0000 mg | ORAL_CAPSULE | Freq: Two times a day (BID) | ORAL | Status: DC
  Administered 2022-01-20: 100 mg via ORAL
  Filled 2022-01-19: qty 1

## 2022-01-19 MED ORDER — METHYLERGONOVINE MALEATE 0.2 MG PO TABS
0.2000 mg | ORAL_TABLET | ORAL | Status: DC | PRN
  Filled 2022-01-19: qty 1

## 2022-01-19 MED ORDER — SENNOSIDES-DOCUSATE SODIUM 8.6-50 MG PO TABS
2.0000 | ORAL_TABLET | ORAL | Status: DC
Start: 2022-01-19 — End: 2022-01-20
  Administered 2022-01-19 – 2022-01-20 (×2): 2 via ORAL
  Filled 2022-01-19 (×2): qty 2

## 2022-01-19 MED ORDER — IBUPROFEN 600 MG PO TABS
600.0000 mg | ORAL_TABLET | Freq: Four times a day (QID) | ORAL | Status: DC
  Administered 2022-01-19 (×3): 600 mg via ORAL
  Filled 2022-01-19 (×2): qty 1

## 2022-01-19 MED ORDER — COCONUT OIL OIL: 1.0000 | TOPICAL_OIL | Status: DC | PRN

## 2022-01-19 MED ORDER — IBUPROFEN 600 MG PO TABS
ORAL_TABLET | ORAL | Status: AC
  Filled 2022-01-19: qty 1

## 2022-01-19 NOTE — Lactation Note (Signed)
This note was copied from a baby's chart. Lactation Consultation Note  Patient Name: Jessica Keith [id number] Date: 01/19/2022 Reason for consult: Initial assessment;Primapara;Term Age:21 hours  Maternal Data Has patient been taught Hand Expression?: Yes Does the patient have breastfeeding experience prior to this delivery?: No  Feeding Mother's Current Feeding Choice: Breast Milk Mom pumping breasts when room entered, states she can't latch baby, baby asleep in bassinet, has not breastfed since 1st feed after birth, awakened by checking diaper, attempted left breast in cradle hold but baby very sleepy and difficult to position and support large breast, mom turned to left side and baby positioned beside her, after few attempts, baby latched well to breast with shaping of breast tissue and support , gentle chin pressure to widen latch, baby gaggy at first and came off breast but then settled and nursed 15 min, burped and mom turned to right side and baby positioned beside her at right breast, latched well to this side after hand expression, nursed 20 min with swallows and also syringe fed at the breast, 1cc EBM pumped earlier.      L   ATCH Score Latch: Grasps breast easily, tongue down, lips flanged, rhythmical sucking.  Audible Swallowing: Spontaneous and intermittent  Type of Nipple: Everted at rest and after stimulation  Comfort (Breast/Nipple): Soft / non-tender  Hold (Positioning): Assistance needed to correctly position infant at breast and maintain latch.  LATCH Score: 9   Lactation Tools Discussed/Used Tools: Pump Breast pump type: Double-Electric Breast Pump Pump Education: Setup, frequency, and cleaning;Milk Storage Reason for Pumping: pt request Pumping frequency: prn Pumped volume: 1 mL  Interventions Interventions: Breast feeding basics reviewed;Assisted with latch;Skin to skin;Hand express;Breast compression;Adjust position;Support pillows;Position  options;DEBP;Education LC name and no written on white board Discharge Pump: Personal;DEBP WIC Program: Yes  Consult Status Consult Status: PRN    Ferol Luz 01/19/2022, 1:35 PM

## 2022-01-19 NOTE — Discharge Summary (Signed)
Patient Name: Jessica Keith DOB: [birth year] MRN: [id number]                            Discharge Summary  Date of Admission: 01/18/2022 Date of Discharge: 01/20/2022 Delivering Provider: Philip Aspen   Admitting Diagnosis: Labor and delivery, indication for care [O75.9] at 2w0dSecondary diagnosis:  Active Problems:   Postpartum care following vaginal delivery  Mode of Delivery: normal spontaneous vaginal delivery              Discharge diagnosis: Term Pregnancy Delivered    Insertion of Nexplanon prior to discharge  Intrapartum Procedures: epidural and laceration right and left labial and perineal abrasion.    Post partum procedures:  nexplanon insertion  Complications: none                     Discharge Day SOAP Note:  Progress Note - Vaginal Delivery  AMargaritaCornell is a 21y.o. G1P0000 now PP day 1 s/p Vaginal, Spontaneous . Delivery was uncomplicated  Subjective  The patient has the following complaints: has no unusual complaints  Pain is controlled with current medications.   Patient is urinating without difficulty.  She is ambulating well.   She has had Nexplanon inserted this morning. Desires discharge. She is bottle feeding.  Objective  Vital signs: BP 117/76 (BP Location: Right Arm)   Pulse 72   Temp 98.3 F (36.8 C) (Oral)   Resp 16   Ht '5\' 3"'$  (1.6 m)   Wt 103.4 kg   LMP 04/08/2021   SpO2 100%   Breastfeeding Unknown   BMI 40.39 kg/m   Physical Exam: Gen: NAD Fundus Fundal Tone: Firm  Lochia Amount: Small        Data Review Labs: Lab Results  Component Value Date   WBC 16.1 (H) 01/19/2022   HGB 9.0 (L) 01/19/2022   HCT 27.1 (L) 01/19/2022   MCV 84.4 01/19/2022   PLT 253 01/19/2022      Latest Ref Rng & Units 01/19/2022    7:00 AM 01/18/2022    7:57 PM 12/03/2021   10:40 PM  CBC  WBC 4.0 - 10.5 K/uL 16.1  13.1  14.2   Hemoglobin 12.0 - 15.0 g/dL 9.0  11.4  10.9   Hematocrit 36.0 - 46.0 % 27.1  34.1  33.3   Platelets 150 -  400 K/uL 253  274  288    O POS  Edinburgh Score:     No data to display          Assessment/Plan  Active Problems:   Postpartum care following vaginal delivery    Plan for discharge today.  Discharge Instructions: Per After Visit Summary. Activity: Advance as tolerated. Pelvic rest for 6 weeks.  Also refer to After Visit Summary Diet: Regular Medications: Allergies as of 01/20/2022   No Known Allergies      Medication List     STOP taking these medications    ondansetron 4 MG tablet Commonly known as: Zofran       TAKE these medications    Fusion Plus Caps Take 1 tablet by mouth daily.   ibuprofen 600 MG tablet Commonly known as: ADVIL Take 1 tablet (600 mg total) by mouth every 6 (six) hours.   PRENATAL PO Take 1 tablet by mouth daily.       Outpatient follow up:   Follow-up Information  [facility]. Schedule an appointment as soon as possible for a visit in 6 week(s).   Contact information: [place]  [place] [phone]        [facility]. Schedule an appointment as soon as possible for a visit in 2 week(s).   Why: Call for a phone appointment in 2 weeks for mood assessment. Contact information: [place]  [place] [phone]               Postpartum contraception: Nexplanon placed on day of discharge in the hospital.  Discharged Condition: good  Discharged to: home  Newborn Data: Disposition:home with mother  Apgars: APGAR (1 MIN): 9   APGAR (5 MINS): 9       Baby Feeding: [place], CNM  01/20/2022 10:24 AM   01/20/2022 10:23 AM

## 2022-01-19 NOTE — Lactation Note (Addendum)
This note was copied from a baby's chart. Lactation Consultation Note  Patient Name: Jessica Keith [id number] Date: 01/19/2022 Reason for consult: Follow-up assessment;Primapara Age:21 hours  Maternal Data    Feeding Mother's Current Feeding Choice: Breast Milk BAby just had bath and is skin to skin on mom's chest, rooting and fussy, moved down to right breast in football hold and assisted mom with latching baby, latched easily and is nursing well with occ stimulation.     LATCH Score Latch: Grasps breast easily, tongue down, lips flanged, rhythmical sucking.  Audible Swallowing: A few with stimulation  Type of Nipple: Everted at rest and after stimulation  Comfort (Breast/Nipple): Soft / non-tender  Hold (Positioning): Assistance needed to correctly position infant at breast and maintain latch.  LATCH Score: 8   Lactation Tools Discussed/Used    Interventions Interventions: Assisted with latch;Skin to skin  Discharge    Consult Status Consult Status: PRN    Ferol Luz 01/19/2022, 4:03 PM

## 2022-01-20 ENCOUNTER — Encounter: Payer: Self-pay | Admitting: Certified Nurse Midwife

## 2022-01-20 DIAGNOSIS — Z30017 Encounter for initial prescription of implantable subdermal contraceptive: Secondary | ICD-10-CM | POA: Diagnosis not present

## 2022-01-20 LAB — RPR: RPR Ser Ql: NONREACTIVE

## 2022-01-20 MED ORDER — LIDOCAINE 1% INJECTION FOR CIRCUMCISION
2.0000 mL | INJECTION | Freq: Once | INTRAVENOUS | Status: DC
Start: 2022-01-20 — End: 2022-01-20

## 2022-01-20 MED ORDER — LIDOCAINE HCL 1 % IJ SOLN: 0.0000 mL | Freq: Once | INTRAMUSCULAR | Status: DC | PRN

## 2022-01-20 MED ORDER — VARICELLA VIRUS VACCINE LIVE 1350 PFU/0.5ML IJ SUSR
0.5000 mL | Freq: Once | INTRAMUSCULAR | Status: AC
  Administered 2022-01-20: 0.5 mL via SUBCUTANEOUS
  Filled 2022-01-20: qty 0.5

## 2022-01-20 MED ORDER — IBUPROFEN 600 MG PO TABS: 600.0000 mg | ORAL_TABLET | Freq: Four times a day (QID) | ORAL | 0 refills | Status: DC

## 2022-01-20 MED ORDER — ETONOGESTREL 68 MG ~~LOC~~ IMPL
68.0000 mg | DRUG_IMPLANT | Freq: Once | SUBCUTANEOUS | Status: DC
  Filled 2022-01-20: qty 1

## 2022-01-20 MED ORDER — ACETAMINOPHEN 325 MG PO TABS
650.0000 mg | ORAL_TABLET | Freq: Four times a day (QID) | ORAL | Status: DC | PRN
  Administered 2022-01-20: 650 mg via ORAL
  Filled 2022-01-20: qty 2

## 2022-01-20 NOTE — Progress Notes (Signed)
Patient opts for Nexplanon for contraception.  She understands that Nexplanon is a progesterone only therapy, and that patients often patients have irregular and unpredictable vaginal bleeding or amenorrhea. She understands that other side effects are possible related to systemic progesterone, including but not limited to, headaches, breast tenderness, nausea, and irritability. While effective at preventing pregnancy long acting reversible contraceptives do not prevent transmission of sexually transmitted diseases and use of barrier methods for this purpose was discussed.  The placement procedure for Nexplanon was reviewed with the patient in detail including risks of nerve injury, infection, bleeding and injury to other muscles or tendons. She understands that the Nexplanon implant is good for 3 years and needs to be removed at the end of that time.  She understands that Nexplanon is an extremely effective option for contraception, with failure rate of <1%.   BP 117/76 (BP Location: Right Arm)   Pulse 72   Temp 98.3 F (36.8 C) (Oral)   Resp 16   Ht '5\' 3"'$  (1.6 m)   Wt 103.4 kg   LMP 04/08/2021   SpO2 100%   Breastfeeding Unknown   BMI 40.39 kg/m  No results found for this or any previous visit (from the past 24 hour(s)).    GYNECOLOGY PROCEDURE NOTE  Patient is a 21 y.o. G1P1001 presenting for Nexplanon insertion as her desires means of contraception.  She provided informed consent, signed copy in the chart, time out was performed. Patient is one day post vaginal delivery, with self reported LMP of Patient's last menstrual period was 04/08/2021.  She understands that Nexplanon is a progesterone only therapy, and that patients often patients have irregular and unpredictable vaginal bleeding or amenorrhea. She understands that other side effects are possible related to systemic progesterone, including but not limited to, headaches, breast tenderness, nausea, and irritability. While effective at  preventing pregnancy long acting reversible contraceptives do not prevent transmission of sexually transmitted diseases and use of barrier methods for this purpose was discussed. The placement procedure for Nexplanon was reviewed with the patient in detail including risks of nerve injury, infection, bleeding and injury to other muscles or tendons. She understands that the Nexplanon implant is good for 3 years and needs to be removed at the end of that time.  She understands that Nexplanon is an extremely effective option for contraception, with failure rate of <1%. This information is reviewed today and all questions were answered. Informed consent was obtained, both verbally and written.   The patient is healthy and has no contraindications to Implanon use. Urine pregnancy test was performed today and was negative.  Procedure Appropriate time out taken.  Patient placed in dorsal supine with her right  arm above head, elbow flexed at 90 degrees, arm resting on examination table.  The bicipital grove was palpated and site 8-10cm proximal to the medial epicondyle was indentified . The insertion site was prepped with a two betadine swabs and then injected with 2 cc of 1% lidocaine without epinephrine.  Nexplanon removed form sterile blister packaging,  Device confirmed in needle, before inserting full length of needle, tenting up the skin as the needle was advance.  The drug eluting rod was then deployed by pulling back the slider per the manufactures recommendation.  The implant was palpable by the clinician as well as the patient.  The insertion site covered dressed with a band aid before applying  a kerlex bandage pressure dressing..Minimal blood loss was noted during the procedure.  The patientt tolerated  the procedure well.   She was instructed to wear the bandage for 24 hours, call with any signs of infection.  She was given the Implanon card and instructed to have the rod removed in 3 years.  Charge  [phone] for nexplanon device, CPT [id number] for procedure J2001 for lidocaine administration Modifer 25, plus Modifer 79 is done during a global billing visit    Imagene Riches, CNM  01/20/2022 10:22 AM

## 2022-01-20 NOTE — Progress Notes (Signed)
Patient discharged home with family.  Discharge instructions, when to follow up, and prescriptions reviewed with patient.  Patient verbalized understanding. Patient will be escorted out by auxiliary.   

## 2022-01-27 NOTE — Anesthesia Postprocedure Evaluation (Signed)
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   Last Vitals: There were no vitals filed for this visit.  Last Pain: There were no vitals filed for this visit.               Arita Miss

## 2022-02-05 ENCOUNTER — Telehealth (INDEPENDENT_AMBULATORY_CARE_PROVIDER_SITE_OTHER): Payer: Medicaid Other | Admitting: Certified Nurse Midwife

## 2022-02-05 ENCOUNTER — Encounter: Payer: Self-pay | Admitting: Certified Nurse Midwife

## 2022-02-05 DIAGNOSIS — Z1331 Encounter for screening for depression: Secondary | ICD-10-CM

## 2022-02-05 MED FILL — Etonogestrel Subdermal Implant 68 MG: SUBCUTANEOUS | Qty: 1 | Status: AC

## 2022-02-05 NOTE — Progress Notes (Signed)
Virtual Visit via Video Note  I connected with Jessica Keith on 02/05/22 at  3:30 PM EDT by a video enabled telemedicine application and verified that I am speaking with the correct person using two identifiers.  Location: Patient: at home Provider: at the office    I discussed the limitations of evaluation and management by telemedicine and the availability of in person appointments. The patient expressed understanding and agreed to proceed.  History of Present Illness: Stats post SVD x 2 wks , mood check   Observations/Objective: Pt doing well over all, states that her bleeding is spotting only and that she is not having any pain. She is bottle feeding formula and pumped breast milk. States her milk production is a little low. Discussed imortance of diet and hydration. Encouraged pumping q 2-3 hours during the day and q 4 at night. She verbalizes and agrees to plan.   Assessment and Plan: EDPS: 8, state she has had a few episodes of anxiety since being home. Reassurance given that it is normal . But she overall states that she feels well. She denies any desire of self harm or harm to others.   Follow Up Instructions: Continue to monitor mood. Call or come in sooner if mood worsens. Follow up as scheduled in office in 4 wks .   A  I discussed the assessment and treatment plan with the patient. The patient was provided an opportunity to ask questions and all were answered. The patient agreed with the plan and demonstrated an understanding of the instructions.   The patient was advised to call back or seek an in-person evaluation if the symptoms worsen or if the condition fails to improve as anticipated.  I provided 10 minutes of non-face-to-face time during this encounter.   Philip Aspen, CNM

## 2022-02-10 MED FILL — Etonogestrel Subdermal Implant 68 MG: SUBCUTANEOUS | Qty: 1 | Status: AC

## 2022-02-15 ENCOUNTER — Emergency Department
Admission: EM | Admit: 2022-02-15 | Discharge: 2022-02-16 | Disposition: A | Discharge status: Home / Self Care | Payer: Medicaid Other | Source: Home / Self Care | Attending: Emergency Medicine | Admitting: Emergency Medicine

## 2022-02-15 ENCOUNTER — Emergency Department: Payer: Medicaid Other

## 2022-02-15 ENCOUNTER — Other Ambulatory Visit: Payer: Self-pay

## 2022-02-15 ENCOUNTER — Encounter: Payer: Self-pay | Admitting: Emergency Medicine

## 2022-02-15 DIAGNOSIS — R7989 Other specified abnormal findings of blood chemistry: Secondary | ICD-10-CM | POA: Diagnosis not present

## 2022-02-15 DIAGNOSIS — R935 Abnormal findings on diagnostic imaging of other abdominal regions, including retroperitoneum: Secondary | ICD-10-CM | POA: Diagnosis not present

## 2022-02-15 DIAGNOSIS — R0789 Other chest pain: Secondary | ICD-10-CM | POA: Insufficient documentation

## 2022-02-15 DIAGNOSIS — O9089 Other complications of the puerperium, not elsewhere classified: Secondary | ICD-10-CM | POA: Diagnosis not present

## 2022-02-15 DIAGNOSIS — R109 Unspecified abdominal pain: Secondary | ICD-10-CM | POA: Diagnosis not present

## 2022-02-15 DIAGNOSIS — O99215 Obesity complicating the puerperium: Secondary | ICD-10-CM | POA: Diagnosis not present

## 2022-02-15 DIAGNOSIS — E669 Obesity, unspecified: Secondary | ICD-10-CM | POA: Diagnosis not present

## 2022-02-15 DIAGNOSIS — Z6830 Body mass index (BMI) 30.0-30.9, adult: Secondary | ICD-10-CM | POA: Diagnosis not present

## 2022-02-15 DIAGNOSIS — R748 Abnormal levels of other serum enzymes: Secondary | ICD-10-CM | POA: Diagnosis not present

## 2022-02-15 DIAGNOSIS — K429 Umbilical hernia without obstruction or gangrene: Secondary | ICD-10-CM | POA: Diagnosis not present

## 2022-02-15 DIAGNOSIS — R079 Chest pain, unspecified: Secondary | ICD-10-CM

## 2022-02-15 DIAGNOSIS — R1010 Upper abdominal pain, unspecified: Secondary | ICD-10-CM | POA: Diagnosis not present

## 2022-02-15 DIAGNOSIS — Z743 Need for continuous supervision: Secondary | ICD-10-CM | POA: Diagnosis not present

## 2022-02-15 DIAGNOSIS — R1011 Right upper quadrant pain: Secondary | ICD-10-CM | POA: Diagnosis not present

## 2022-02-15 DIAGNOSIS — R7401 Elevation of levels of liver transaminase levels: Secondary | ICD-10-CM | POA: Insufficient documentation

## 2022-02-15 LAB — COMPREHENSIVE METABOLIC PANEL
ALT: 24 U/L (ref 0–44)
AST: 44 U/L — ABNORMAL HIGH (ref 15–41)
Albumin: 3.7 g/dL (ref 3.5–5.0)
Alkaline Phosphatase: 123 U/L (ref 38–126)
Anion gap: 5 (ref 5–15)
BUN: 18 mg/dL (ref 6–20)
CO2: 21 mmol/L — ABNORMAL LOW (ref 22–32)
Calcium: 8.6 mg/dL — ABNORMAL LOW (ref 8.9–10.3)
Chloride: 111 mmol/L (ref 98–111)
Creatinine, Ser: 0.79 mg/dL (ref 0.44–1.00)
GFR, Estimated: >60 mL/min (ref >60)
Glucose, Bld: 92 mg/dL (ref 70–99)
Potassium: 4.1 mmol/L (ref 3.5–5.1)
Sodium: 137 mmol/L (ref 135–145)
Total Bilirubin: 0.7 mg/dL (ref 0.3–1.2)
Total Protein: 7.2 g/dL (ref 6.5–8.1)

## 2022-02-15 LAB — URINALYSIS, ROUTINE W REFLEX MICROSCOPIC
Bilirubin Urine: NEGATIVE
Glucose, UA: NEGATIVE mg/dL
Ketones, ur: NEGATIVE mg/dL
Nitrite: NEGATIVE
Protein, ur: NEGATIVE mg/dL
RBC / HPF: >50 RBC/hpf — ABNORMAL HIGH (ref 0–5)
Specific Gravity, Urine: 1.02 (ref 1.005–1.030)
pH: 6 (ref 5.0–8.0)

## 2022-02-15 LAB — LIPASE, BLOOD: Lipase: 33 U/L (ref 11–51)

## 2022-02-15 LAB — POC URINE PREG, ED: Preg Test, Ur: NEGATIVE

## 2022-02-15 NOTE — ED Triage Notes (Signed)
FIRST NURSE NOTE:  Pt arrived via [facility] with reports of recent pregnancy.  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 No N/V/D  VSS per EMS.   Pt reports RUQ pain. No hx of GB problems.

## 2022-02-15 NOTE — ED Notes (Signed)
Received phone call from mother states daughter is complaining of chest pain. Pt taken to room 4 to be evaluated by EDP. No distress noted.  Pt had been seen rocking back and forth in wheelchair.

## 2022-02-15 NOTE — ED Triage Notes (Signed)
Pt reports abdominal pain epigastric area that radiates to left and right side. Pt denies any N/V/D. Pt talks in complete sentences no respiratory distress noted.

## 2022-02-16 ENCOUNTER — Observation Stay
Admission: EM | Admit: 2022-02-16 | Discharge: 2022-02-17 | Disposition: A | Discharge status: Home / Self Care | Payer: Medicaid Other | Attending: Licensed Practical Nurse | Admitting: Licensed Practical Nurse

## 2022-02-16 ENCOUNTER — Emergency Department: Payer: Medicaid Other

## 2022-02-16 ENCOUNTER — Other Ambulatory Visit: Payer: Self-pay

## 2022-02-16 DIAGNOSIS — R0789 Other chest pain: Secondary | ICD-10-CM | POA: Insufficient documentation

## 2022-02-16 DIAGNOSIS — R109 Unspecified abdominal pain: Secondary | ICD-10-CM | POA: Diagnosis not present

## 2022-02-16 DIAGNOSIS — O99215 Obesity complicating the puerperium: Secondary | ICD-10-CM | POA: Insufficient documentation

## 2022-02-16 DIAGNOSIS — Z6830 Body mass index (BMI) 30.0-30.9, adult: Secondary | ICD-10-CM | POA: Insufficient documentation

## 2022-02-16 DIAGNOSIS — O9089 Other complications of the puerperium, not elsewhere classified: Principal | ICD-10-CM | POA: Insufficient documentation

## 2022-02-16 DIAGNOSIS — R7989 Other specified abnormal findings of blood chemistry: Secondary | ICD-10-CM | POA: Diagnosis present

## 2022-02-16 DIAGNOSIS — R935 Abnormal findings on diagnostic imaging of other abdominal regions, including retroperitoneum: Secondary | ICD-10-CM | POA: Diagnosis not present

## 2022-02-16 DIAGNOSIS — E669 Obesity, unspecified: Secondary | ICD-10-CM | POA: Diagnosis present

## 2022-02-16 DIAGNOSIS — R7401 Elevation of levels of liver transaminase levels: Secondary | ICD-10-CM | POA: Insufficient documentation

## 2022-02-16 DIAGNOSIS — R1011 Right upper quadrant pain: Secondary | ICD-10-CM | POA: Diagnosis not present

## 2022-02-16 DIAGNOSIS — K429 Umbilical hernia without obstruction or gangrene: Secondary | ICD-10-CM | POA: Diagnosis not present

## 2022-02-16 DIAGNOSIS — R748 Abnormal levels of other serum enzymes: Secondary | ICD-10-CM | POA: Diagnosis present

## 2022-02-16 DIAGNOSIS — R079 Chest pain, unspecified: Secondary | ICD-10-CM | POA: Diagnosis not present

## 2022-02-16 LAB — CBC WITH DIFFERENTIAL/PLATELET
Abs Immature Granulocytes: 0.04 10*3/uL (ref 0.00–0.07)
Basophils Absolute: 0 10*3/uL (ref 0.0–0.1)
Basophils Relative: 0 %
Eosinophils Absolute: 0.1 10*3/uL (ref 0.0–0.5)
Eosinophils Relative: 1 %
HCT: 35.5 % — ABNORMAL LOW (ref 36.0–46.0)
Hemoglobin: 11.2 g/dL — ABNORMAL LOW (ref 12.0–15.0)
Immature Granulocytes: 0 %
Lymphocytes Relative: 21 %
Lymphs Abs: 3 10*3/uL (ref 0.7–4.0)
MCH: 27.1 pg (ref 26.0–34.0)
MCHC: 31.5 g/dL (ref 30.0–36.0)
MCV: 86 fL (ref 80.0–100.0)
Monocytes Absolute: 0.8 10*3/uL (ref 0.1–1.0)
Monocytes Relative: 6 %
Neutro Abs: 10.4 10*3/uL — ABNORMAL HIGH (ref 1.7–7.7)
Neutrophils Relative %: 72 %
Platelets: 319 10*3/uL (ref 150–400)
RBC: 4.13 MIL/uL (ref 3.87–5.11)
RDW: 13.4 % (ref 11.5–15.5)
WBC: 14.4 10*3/uL — ABNORMAL HIGH (ref 4.0–10.5)
nRBC: 0 % (ref 0.0–0.2)

## 2022-02-16 LAB — COMPREHENSIVE METABOLIC PANEL
ALT: 286 U/L — ABNORMAL HIGH (ref 0–44)
AST: 322 U/L — ABNORMAL HIGH (ref 15–41)
Albumin: 4 g/dL (ref 3.5–5.0)
Alkaline Phosphatase: 211 U/L — ABNORMAL HIGH (ref 38–126)
Anion gap: 7 (ref 5–15)
BUN: 12 mg/dL (ref 6–20)
CO2: 23 mmol/L (ref 22–32)
Calcium: 9 mg/dL (ref 8.9–10.3)
Chloride: 107 mmol/L (ref 98–111)
Creatinine, Ser: 0.78 mg/dL (ref 0.44–1.00)
GFR, Estimated: >60 mL/min (ref >60)
Glucose, Bld: 86 mg/dL (ref 70–99)
Potassium: 4 mmol/L (ref 3.5–5.1)
Sodium: 137 mmol/L (ref 135–145)
Total Bilirubin: 2.1 mg/dL — ABNORMAL HIGH (ref 0.3–1.2)
Total Protein: 7.8 g/dL (ref 6.5–8.1)

## 2022-02-16 LAB — MAGNESIUM: Magnesium: 2.6 mg/dL — ABNORMAL HIGH (ref 1.7–2.4)

## 2022-02-16 LAB — URINALYSIS, ROUTINE W REFLEX MICROSCOPIC
Bacteria, UA: NONE SEEN
Bilirubin Urine: NEGATIVE
Glucose, UA: NEGATIVE mg/dL
Ketones, ur: NEGATIVE mg/dL
Nitrite: NEGATIVE
Protein, ur: NEGATIVE mg/dL
RBC / HPF: >50 RBC/hpf — ABNORMAL HIGH (ref 0–5)
Specific Gravity, Urine: 1.032 — ABNORMAL HIGH (ref 1.005–1.030)
pH: 6 (ref 5.0–8.0)

## 2022-02-16 LAB — PROTIME-INR
INR: 1.1 (ref 0.8–1.2)
Prothrombin Time: 13.7 seconds (ref 11.4–15.2)

## 2022-02-16 LAB — CBC
HCT: 37.3 % (ref 36.0–46.0)
Hemoglobin: 11.8 g/dL — ABNORMAL LOW (ref 12.0–15.0)
MCH: 26.9 pg (ref 26.0–34.0)
MCHC: 31.6 g/dL (ref 30.0–36.0)
MCV: 85.2 fL (ref 80.0–100.0)
Platelets: 316 10*3/uL (ref 150–400)
RBC: 4.38 MIL/uL (ref 3.87–5.11)
RDW: 13.3 % (ref 11.5–15.5)
WBC: 8.9 10*3/uL (ref 4.0–10.5)
nRBC: 0 % (ref 0.0–0.2)

## 2022-02-16 LAB — PHOSPHORUS: Phosphorus: 3.7 mg/dL (ref 2.5–4.6)

## 2022-02-16 LAB — APTT: aPTT: 33 seconds (ref 24–36)

## 2022-02-16 LAB — LIPASE, BLOOD: Lipase: 37 U/L (ref 11–51)

## 2022-02-16 LAB — POC URINE PREG, ED: Preg Test, Ur: NEGATIVE

## 2022-02-16 MED ORDER — BENZOCAINE-MENTHOL 20-0.5 % EX AERO
INHALATION_SPRAY | CUTANEOUS | Status: AC
  Filled 2022-02-16: qty 56

## 2022-02-16 MED ORDER — PANTOPRAZOLE SODIUM 40 MG PO TBEC
40.0000 mg | DELAYED_RELEASE_TABLET | Freq: Two times a day (BID) | ORAL | Status: DC
  Administered 2022-02-17: 40 mg via ORAL
  Filled 2022-02-16 (×2): qty 1

## 2022-02-16 MED ORDER — ONDANSETRON HCL 4 MG PO TABS: 4.0000 mg | ORAL_TABLET | Freq: Four times a day (QID) | ORAL | Status: DC | PRN

## 2022-02-16 MED ORDER — ENOXAPARIN SODIUM 60 MG/0.6ML IJ SOSY
0.5000 mg/kg | PREFILLED_SYRINGE | INTRAMUSCULAR | Status: DC
  Filled 2022-02-16: qty 0.45

## 2022-02-16 MED ORDER — LACTATED RINGERS IV SOLN: INTRAVENOUS | Status: DC

## 2022-02-16 MED ORDER — CALCIUM GLUCONATE 10 % IV SOLN
INTRAVENOUS | Status: AC
  Filled 2022-02-16: qty 10

## 2022-02-16 MED ORDER — PANTOPRAZOLE SODIUM 40 MG PO TBEC: 40.0000 mg | DELAYED_RELEASE_TABLET | Freq: Every day | ORAL | 0 refills | Status: DC | PRN

## 2022-02-16 MED ORDER — ALUM & MAG HYDROXIDE-SIMETH 200-200-20 MG/5ML PO SUSP
30.0000 mL | Freq: Once | ORAL | Status: AC
  Administered 2022-02-16: 30 mL via ORAL
  Filled 2022-02-16: qty 30

## 2022-02-16 MED ORDER — OXYCODONE-ACETAMINOPHEN 5-325 MG PO TABS: 1.0000 | ORAL_TABLET | Freq: Four times a day (QID) | ORAL | Status: DC | PRN

## 2022-02-16 MED ORDER — GADOBUTROL 1 MMOL/ML IV SOLN
9.0000 mL | Freq: Once | INTRAVENOUS | Status: AC | PRN
  Administered 2022-02-16: 10 mL via INTRAVENOUS

## 2022-02-16 MED ORDER — KETOROLAC TROMETHAMINE 30 MG/ML IJ SOLN: 30.0000 mg | Freq: Once | INTRAMUSCULAR | Status: DC

## 2022-02-16 MED ORDER — ENOXAPARIN SODIUM 40 MG/0.4ML IJ SOSY: 40.0000 mg | PREFILLED_SYRINGE | INTRAMUSCULAR | Status: DC

## 2022-02-16 MED ORDER — PRENATAL PLUS 27-1 MG PO TABS
1.0000 | ORAL_TABLET | Freq: Every day | ORAL | Status: DC
  Administered 2022-02-17: 1 via ORAL
  Filled 2022-02-16: qty 1

## 2022-02-16 MED ORDER — MORPHINE SULFATE (PF) 4 MG/ML IV SOLN
4.0000 mg | Freq: Once | INTRAVENOUS | Status: AC
  Administered 2022-02-16: 4 mg via INTRAVENOUS
  Filled 2022-02-16: qty 1

## 2022-02-16 MED ORDER — ONDANSETRON HCL 4 MG/2ML IJ SOLN
4.0000 mg | Freq: Once | INTRAMUSCULAR | Status: AC
  Administered 2022-02-16: 4 mg via INTRAVENOUS
  Filled 2022-02-16: qty 2

## 2022-02-16 MED ORDER — GADOBUTROL 1 MMOL/ML IV SOLN: 10.0000 mL | Freq: Once | INTRAVENOUS | Status: DC | PRN

## 2022-02-16 MED ORDER — IBUPROFEN 600 MG PO TABS
600.0000 mg | ORAL_TABLET | Freq: Four times a day (QID) | ORAL | Status: DC | PRN
  Administered 2022-02-17: 600 mg via ORAL
  Filled 2022-02-16: qty 1

## 2022-02-16 MED ORDER — PANTOPRAZOLE SODIUM 40 MG IV SOLR
40.0000 mg | Freq: Once | INTRAVENOUS | Status: AC
  Administered 2022-02-16: 40 mg via INTRAVENOUS
  Filled 2022-02-16: qty 10

## 2022-02-16 MED ORDER — BREAST MILK/FORMULA (FOR LABEL PRINTING ONLY): ORAL | Status: DC

## 2022-02-16 MED ORDER — ONDANSETRON HCL 4 MG/2ML IJ SOLN
4.0000 mg | Freq: Four times a day (QID) | INTRAMUSCULAR | Status: DC | PRN
Start: 2022-02-16 — End: 2022-02-17

## 2022-02-16 MED ORDER — SODIUM CHLORIDE 0.9 % IV BOLUS (SEPSIS)
1000.0000 mL | Freq: Once | INTRAVENOUS | Status: AC
  Administered 2022-02-16: 1000 mL via INTRAVENOUS

## 2022-02-16 MED ORDER — ACETAMINOPHEN 500 MG PO TABS
1000.0000 mg | ORAL_TABLET | Freq: Once | ORAL | Status: AC
  Administered 2022-02-16: 1000 mg via ORAL
  Filled 2022-02-16: qty 2

## 2022-02-16 MED ORDER — OXYCODONE HCL 5 MG PO TABS: 5.0000 mg | ORAL_TABLET | ORAL | Status: DC | PRN

## 2022-02-16 MED ORDER — SENNOSIDES-DOCUSATE SODIUM 8.6-50 MG PO TABS
1.0000 | ORAL_TABLET | Freq: Every evening | ORAL | Status: DC | PRN
Start: 2022-02-16 — End: 2022-02-17

## 2022-02-16 MED ORDER — ACETAMINOPHEN 325 MG RE SUPP: 325.0000 mg | Freq: Four times a day (QID) | RECTAL | Status: DC | PRN

## 2022-02-16 MED ORDER — MAGNESIUM SULFATE 40 GM/1000ML IV SOLN
2.0000 g/h | INTRAVENOUS | Status: DC
  Administered 2022-02-16: 2 g/h via INTRAVENOUS
  Filled 2022-02-16: qty 1000

## 2022-02-16 MED ORDER — MAGNESIUM SULFATE BOLUS VIA INFUSION
4.0000 g | Freq: Once | INTRAVENOUS | Status: AC
  Administered 2022-02-16: 4 g via INTRAVENOUS
  Filled 2022-02-16: qty 1000

## 2022-02-16 MED ORDER — ACETAMINOPHEN 500 MG PO TABS: 500.0000 mg | ORAL_TABLET | Freq: Four times a day (QID) | ORAL | Status: DC | PRN

## 2022-02-16 MED ORDER — ONDANSETRON 4 MG PO TBDP: 4.0000 mg | ORAL_TABLET | Freq: Four times a day (QID) | ORAL | 0 refills | Status: DC | PRN

## 2022-02-16 MED ORDER — IOHEXOL 350 MG/ML SOLN
100.0000 mL | Freq: Once | INTRAVENOUS | Status: AC | PRN
  Administered 2022-02-16: 100 mL via INTRAVENOUS

## 2022-02-16 NOTE — Hospital Course (Signed)
Ms. Jessica Keith is a 20-year-old female with history of obesity, recent spontaneous vaginal delivery on 01/19/2022, small umbilical hernia, presents emergency department for chief concerns of epigastric and right upper quadrant abdominal pain.  Initial vitals in the emergency department showed temperature of 98.6, respiration rate of 16, heart rate of 65, blood pressure of 125/86, SPO2 of 100% on room air.  Serum sodium is 137, potassium 4.0, chloride 107, bicarb 23, BUN of 12, serum creatinine of 0.78, GFR greater than 60, nonfasting blood glucose 86, WBC 8.9, hemoglobin 11.8, platelets of 316.  Alk phos was elevated at 211, AST 322, ALT 286.  Pregnancy was negative.  Lipase was negative at 37.  ED treatment: Tylenol 1 g p.o. and Maalox.  EDP consulted GI specialist, Dr. Vanga who recommends to consult general surgery.  General surgeon was consulted by EDP and Dr. Piscoya states no surgical indication at this time.  Patient requesting to stay overnight to observe to ensure that LFTs are downtrending. 

## 2022-02-16 NOTE — ED Notes (Signed)
Called L&D to give report. Informed that bed placement needs to be changed from mother baby to birthplace.

## 2022-02-16 NOTE — Progress Notes (Signed)
PHARMACIST - PHYSICIAN COMMUNICATION  CONCERNING:  Enoxaparin (Lovenox) for DVT Prophylaxis    RECOMMENDATION: Patient was prescribed enoxaprin '40mg'$  q24 hours for VTE prophylaxis.   There were no vitals filed for this visit.  There is no height or weight on file to calculate BMI.  Estimated Creatinine Clearance: 120.9 mL/min (by C-G formula based on SCr of 0.78 mg/dL).   Based on [place] patient is candidate for enoxaparin 0.'5mg'$ /kg TBW SQ every 24 hours based on BMI being >30.  DESCRIPTION: Pharmacy has adjusted enoxaparin dose per [facility] policy.  Patient is now receiving enoxaparin 45 mg every 24 hours    Vira Blanco, PharmD Clinical Pharmacist  02/16/2022 6:10 PM

## 2022-02-16 NOTE — H&P (Signed)
OB History & Physical   History of Present Illness:  Chief Complaint:   HPI:  Jessica Keith is a 21 y.o. G13P1001 female at 38wks PP with RUQ pain and Elevated LFT's. She had epigastric pain on Friday, but it went away.  The pain returned on Saturday night, it was worse and not going away so she was taken to the ED via EMS.  She was evaluated in the ED and then sent home early this morning. She returned to the ED this afternoon with the same pain. The pain "comes and goes" like a cramp, is sharp, it is in her epigastric area and under her right rib. It feels better when she sits up and applies pressure to the area. Denies N/V/D or fevers. Has had normal BM's once or twice a day. Has had a normal appetite, does not eat as often as she is a busy new mother. She has had heartburn in pregnancy, this does not feel like heartburn.  Currently she does not have the pain. She was given Tylenol in the ED, but has not taken it while at home. She has used Motrin at home.  Denies any alcohol or use of any OTC or prescription drugs that could affect her liver. She had an uncomplicated pregnancy. Her blood pressures were normal throughout pregnancy and her postpartum stay.   Imaging shows normal gall bladder and liver.  Her LFT's have increased from 44 and 24 on 7/29 to 322 and 286 on 7/30, all other labs are normal.  Given that she is 4wks PP, has normal imaging and abnormal LFT's she was admitted to L and D for a Magnesium infusion.      Maternal Medical History:   Past Medical History:  Diagnosis Date   Labor and delivery, indication for care 01/17/2022   Medical history non-contributory    Umbilical hernia     Past Surgical History:  Procedure Laterality Date   ADENOIDECTOMY      No Known Allergies  Prior to Admission medications   Medication Sig Start Date End Date Taking? Authorizing Provider  ibuprofen (ADVIL) 600 MG tablet Take 1 tablet (600 mg total) by mouth every 6 (six) hours. Patient  taking differently: Take 600 mg by mouth every 6 (six) hours as needed for mild pain or moderate pain. 01/20/22  Yes Imagene Riches, CNM  Iron-FA-B Cmp-C-Biot-Probiotic (FUSION PLUS) CAPS Take 1 tablet by mouth daily. 10/29/21  Yes Swanson, Melissa M, CNM  pantoprazole (PROTONIX) 40 MG tablet Take 1 tablet (40 mg total) by mouth daily as needed. 02/16/22 02/16/23 Yes Ward, Delice Bison, DO  Prenatal Vit-Fe Fumarate-FA (PRENATAL PO) Take 1 tablet by mouth daily.   Yes [provider]  ondansetron (ZOFRAN-ODT) 4 MG disintegrating tablet Take 1 tablet (4 mg total) by mouth every 6 (six) hours as needed for nausea or vomiting. Patient not taking: Reported on 02/16/2022 02/16/22   Ward, Delice Bison, DO     Prenatal care site: [facility]   Social History: She  reports that she has never smoked. She has never used smokeless tobacco. She reports that she does not drink alcohol and does not use drugs.  Family History: family history includes Cancer in her maternal grandfather; Hypertension in her father and mother.   Review of Systems: A full review of systems was performed and negative except as noted in the HPI.     Physical Exam:  Vital Signs: BP 118/64 (BP Location: Left Arm)   Pulse 65  Temp 98.4 F (36.9 C) (Oral)   Resp 16   LMP 02/13/2022 (Exact Date)   SpO2 100%  General: no acute distress.  HEENT: normocephalic, atraumatic Heart: regular rate & rhythm.  No murmurs/rubs/gallops Lungs: clear to auscultation bilaterally, normal respiratory effort Abdomen:soft,  non tender, no masses, liver not palpable.  Pelvic: deferred     Extremities: non-tender, symmetric, No edema bilaterally.  DTRs: +2  Neurologic: Alert & oriented x 3.    Results for orders placed or performed during the hospital encounter of 02/16/22 (from the past 24 hour(s))  Urinalysis, Routine w reflex microscopic     Status: Abnormal   Collection Time: 02/16/22  1:25 PM  Result Value Ref Range   Color, Urine AMBER  (A) YELLOW   APPearance HAZY (A) CLEAR   Specific Gravity, Urine 1.032 (H) 1.005 - 1.030   pH 6.0 5.0 - 8.0   Glucose, UA NEGATIVE NEGATIVE mg/dL   Hgb urine dipstick LARGE (A) NEGATIVE   Bilirubin Urine NEGATIVE NEGATIVE   Ketones, ur NEGATIVE NEGATIVE mg/dL   Protein, ur NEGATIVE NEGATIVE mg/dL   Nitrite NEGATIVE NEGATIVE   Leukocytes,Ua SMALL (A) NEGATIVE   RBC / HPF >50 (H) 0 - 5 RBC/hpf   WBC, UA 21-50 0 - 5 WBC/hpf   Bacteria, UA NONE SEEN NONE SEEN   Squamous Epithelial / LPF 0-5 0 - 5   Mucus PRESENT   Lipase, blood     Status: None   Collection Time: 02/16/22  1:45 PM  Result Value Ref Range   Lipase 37 11 - 51 U/L  Comprehensive metabolic panel     Status: Abnormal   Collection Time: 02/16/22  1:45 PM  Result Value Ref Range   Sodium 137 135 - 145 mmol/L   Potassium 4.0 3.5 - 5.1 mmol/L   Chloride 107 98 - 111 mmol/L   CO2 23 22 - 32 mmol/L   Glucose, Bld 86 70 - 99 mg/dL   BUN 12 6 - 20 mg/dL   Creatinine, Ser 0.78 0.44 - 1.00 mg/dL   Calcium 9.0 8.9 - 10.3 mg/dL   Total Protein 7.8 6.5 - 8.1 g/dL   Albumin 4.0 3.5 - 5.0 g/dL   AST 322 (H) 15 - 41 U/L   ALT 286 (H) 0 - 44 U/L   Alkaline Phosphatase 211 (H) 38 - 126 U/L   Total Bilirubin 2.1 (H) 0.3 - 1.2 mg/dL   GFR, Estimated >60 >60 mL/min   Anion gap 7 5 - 15  CBC     Status: Abnormal   Collection Time: 02/16/22  1:45 PM  Result Value Ref Range   WBC 8.9 4.0 - 10.5 K/uL   RBC 4.38 3.87 - 5.11 MIL/uL   Hemoglobin 11.8 (L) 12.0 - 15.0 g/dL   HCT 37.3 36.0 - 46.0 %   MCV 85.2 80.0 - 100.0 fL   MCH 26.9 26.0 - 34.0 pg   MCHC 31.6 30.0 - 36.0 g/dL   RDW 13.3 11.5 - 15.5 %   Platelets 316 150 - 400 K/uL   nRBC 0.0 0.0 - 0.2 %  POC urine preg, ED     Status: None   Collection Time: 02/16/22  2:16 PM  Result Value Ref Range   Preg Test, Ur Negative Negative  Protime-INR     Status: None   Collection Time: 02/16/22  5:00 PM  Result Value Ref Range   Prothrombin Time 13.7 11.4 - 15.2 seconds   INR 1.1  0.8 -  1.2  APTT     Status: None   Collection Time: 02/16/22  5:00 PM  Result Value Ref Range   aPTT 33 24 - 36 seconds    Pertinent Results:    MR ABDOMEN MRCP W WO CONTAST  Result Date: 02/16/2022 CLINICAL DATA:  Abdominal pain.  Abnormal liver function tests. EXAM: MRI ABDOMEN WITHOUT AND WITH CONTRAST (INCLUDING MRCP) TECHNIQUE: Multiplanar multisequence MR imaging of the abdomen was performed both before and after the administration of intravenous contrast. Heavily T2-weighted images of the biliary and pancreatic ducts were obtained, and three-dimensional MRCP images were rendered by post processing. CONTRAST:  42m GADAVIST GADOBUTROL 1 MMOL/ML IV SOLN COMPARISON:  None Available. FINDINGS: Lower chest: No acute findings. Hepatobiliary: No hepatic masses identified. No evidence of hepatic steatosis. Gallbladder is unremarkable. No evidence of biliary ductal dilatation. Pancreas: No mass or inflammatory changes. No evidence of pancreatic ductal dilatation or pancreas divisum. Spleen:  Within normal limits in size and appearance. Adrenals/Urinary Tract: No masses identified. No evidence of hydronephrosis. Stomach/Bowel: Unremarkable. Vascular/Lymphatic: No pathologically enlarged lymph nodes identified. No acute vascular findings. Other:  Small umbilical hernia is seen, which contains only fat. Musculoskeletal:  No suspicious bone lesions identified. IMPRESSION: No acute findings. Small umbilical hernia, which contains only fat. Electronically Signed   By: JMarlaine HindM.D.   On: 02/16/2022 17:21   MR 3D Recon At Scanner  Result Date: 02/16/2022 CLINICAL DATA:  Abdominal pain.  Abnormal liver function tests. EXAM: MRI ABDOMEN WITHOUT AND WITH CONTRAST (INCLUDING MRCP) TECHNIQUE: Multiplanar multisequence MR imaging of the abdomen was performed both before and after the administration of intravenous contrast. Heavily T2-weighted images of the biliary and pancreatic ducts were obtained, and  three-dimensional MRCP images were rendered by post processing. CONTRAST:  130mGADAVIST GADOBUTROL 1 MMOL/ML IV SOLN COMPARISON:  None Available. FINDINGS: Lower chest: No acute findings. Hepatobiliary: No hepatic masses identified. No evidence of hepatic steatosis. Gallbladder is unremarkable. No evidence of biliary ductal dilatation. Pancreas: No mass or inflammatory changes. No evidence of pancreatic ductal dilatation or pancreas divisum. Spleen:  Within normal limits in size and appearance. Adrenals/Urinary Tract: No masses identified. No evidence of hydronephrosis. Stomach/Bowel: Unremarkable. Vascular/Lymphatic: No pathologically enlarged lymph nodes identified. No acute vascular findings. Other:  Small umbilical hernia is seen, which contains only fat. Musculoskeletal:  No suspicious bone lesions identified. IMPRESSION: No acute findings. Small umbilical hernia, which contains only fat. Electronically Signed   By: JoMarlaine Hind.D.   On: 02/16/2022 17:21   CT Angio Chest PE W and/or Wo Contrast  Result Date: 02/16/2022 CLINICAL DATA:  Chest and abdominal pain EXAM: CT ANGIOGRAPHY CHEST CT ABDOMEN AND PELVIS WITH CONTRAST TECHNIQUE: Multidetector CT imaging of the chest was performed using the standard protocol during bolus administration of intravenous contrast. Multiplanar CT image reconstructions and MIPs were obtained to evaluate the vascular anatomy. Multidetector CT imaging of the abdomen and pelvis was performed using the standard protocol during bolus administration of intravenous contrast. RADIATION DOSE REDUCTION: This exam was performed according to the departmental dose-optimization program which includes automated exposure control, adjustment of the mA and/or kV according to patient size and/or use of iterative reconstruction technique. CONTRAST:  10071mMNIPAQUE IOHEXOL 350 MG/ML SOLN COMPARISON:  None Available. FINDINGS: CTA CHEST FINDINGS Cardiovascular: Thoracic aorta shows a normal  branching pattern. No aneurysmal dilatation or dissection is noted. No cardiac enlargement is seen. The pulmonary artery shows a normal branching pattern bilaterally. No filling defects to suggest pulmonary embolism  are noted. Mediastinum/Nodes: Thoracic inlet is within normal limits. No sizable hilar or mediastinal adenopathy is noted. Residual thymus tissue is noted in the anterior mediastinum. The esophagus as visualized is within normal limits. Lungs/Pleura: Lungs are well aerated bilaterally. No focal infiltrate or sizable effusion is noted. Musculoskeletal: No acute rib abnormality is noted. No acute abnormality in the thoracic spine is seen. Breasts are engorged consistent with the recent postpartum state. Review of the MIP images confirms the above findings. CT ABDOMEN and PELVIS FINDINGS Hepatobiliary: No focal liver abnormality is seen. No gallstones, gallbladder wall thickening, or biliary dilatation. Pancreas: Unremarkable. No pancreatic ductal dilatation or surrounding inflammatory changes. Spleen: Normal in size without focal abnormality. Adrenals/Urinary Tract: Adrenal glands are within normal limits. Kidneys are well visualized bilaterally. No renal calculi or obstructive changes are seen. Mild rotation defect is noted within the right kidney although no obstructive changes are seen. The bladder is partially distended. Stomach/Bowel: No obstructive or inflammatory changes of the colon are seen. The appendix is within normal limits. Small bowel and stomach are unremarkable. Vascular/Lymphatic: No significant vascular findings are present. No enlarged abdominal or pelvic lymph nodes. Reproductive: Uterus and bilateral adnexa are unremarkable. Other: Small fat containing supraumbilical hernia is noted. Musculoskeletal: No acute or significant osseous findings. Review of the MIP images confirms the above findings. IMPRESSION: CTA of the chest: No evidence of pulmonary emboli. No acute abnormality seen.  CT of the abdomen and pelvis: Small supraumbilical fat containing hernia. No other focal abnormality is seen. Electronically Signed   By: Inez Catalina M.D.   On: 02/16/2022 01:02   CT ABDOMEN PELVIS W CONTRAST  Result Date: 02/16/2022 CLINICAL DATA:  Chest and abdominal pain EXAM: CT ANGIOGRAPHY CHEST CT ABDOMEN AND PELVIS WITH CONTRAST TECHNIQUE: Multidetector CT imaging of the chest was performed using the standard protocol during bolus administration of intravenous contrast. Multiplanar CT image reconstructions and MIPs were obtained to evaluate the vascular anatomy. Multidetector CT imaging of the abdomen and pelvis was performed using the standard protocol during bolus administration of intravenous contrast. RADIATION DOSE REDUCTION: This exam was performed according to the departmental dose-optimization program which includes automated exposure control, adjustment of the mA and/or kV according to patient size and/or use of iterative reconstruction technique. CONTRAST:  179m OMNIPAQUE IOHEXOL 350 MG/ML SOLN COMPARISON:  None Available. FINDINGS: CTA CHEST FINDINGS Cardiovascular: Thoracic aorta shows a normal branching pattern. No aneurysmal dilatation or dissection is noted. No cardiac enlargement is seen. The pulmonary artery shows a normal branching pattern bilaterally. No filling defects to suggest pulmonary embolism are noted. Mediastinum/Nodes: Thoracic inlet is within normal limits. No sizable hilar or mediastinal adenopathy is noted. Residual thymus tissue is noted in the anterior mediastinum. The esophagus as visualized is within normal limits. Lungs/Pleura: Lungs are well aerated bilaterally. No focal infiltrate or sizable effusion is noted. Musculoskeletal: No acute rib abnormality is noted. No acute abnormality in the thoracic spine is seen. Breasts are engorged consistent with the recent postpartum state. Review of the MIP images confirms the above findings. CT ABDOMEN and PELVIS FINDINGS  Hepatobiliary: No focal liver abnormality is seen. No gallstones, gallbladder wall thickening, or biliary dilatation. Pancreas: Unremarkable. No pancreatic ductal dilatation or surrounding inflammatory changes. Spleen: Normal in size without focal abnormality. Adrenals/Urinary Tract: Adrenal glands are within normal limits. Kidneys are well visualized bilaterally. No renal calculi or obstructive changes are seen. Mild rotation defect is noted within the right kidney although no obstructive changes are seen. The bladder is partially  distended. Stomach/Bowel: No obstructive or inflammatory changes of the colon are seen. The appendix is within normal limits. Small bowel and stomach are unremarkable. Vascular/Lymphatic: No significant vascular findings are present. No enlarged abdominal or pelvic lymph nodes. Reproductive: Uterus and bilateral adnexa are unremarkable. Other: Small fat containing supraumbilical hernia is noted. Musculoskeletal: No acute or significant osseous findings. Review of the MIP images confirms the above findings. IMPRESSION: CTA of the chest: No evidence of pulmonary emboli. No acute abnormality seen. CT of the abdomen and pelvis: Small supraumbilical fat containing hernia. No other focal abnormality is seen. Electronically Signed   By: Inez Catalina M.D.   On: 02/16/2022 01:02   US ABDOMEN LIMITED RUQ (LIVER/GB)  Result Date: 02/15/2022 CLINICAL DATA:  Right upper quadrant abdominal pain for 2 days. EXAM: ULTRASOUND ABDOMEN LIMITED RIGHT UPPER QUADRANT COMPARISON:  None Available. FINDINGS: Gallbladder: No gallstones or wall thickening visualized. No sonographic Murphy sign noted by sonographer. Common bile duct: Diameter: 5 mm, normal Liver: No focal lesion identified. Within normal limits in parenchymal echogenicity. Portal vein is patent on color Doppler imaging with normal direction of blood flow towards the liver. Other: None. IMPRESSION: No evidence of cholelithiasis or acute  cholecystitis. Electronically Signed   By: Lucienne Capers M.D.   On: 02/15/2022 23:36    Assessment:  Chanel Mcadams is a 21 y.o. G71P1001 female at 67wks PP admitted with elevated liver enzymes   Plan:  Admit to Labor & Delivery, Dr Marcelline Mates called reviewed plan of care  2. Magnesium 4 grams loading followed by 2 grams/hour x 12 hours 3. Repeat CBC and LFT's 8 hours from starting magnesium  4. Pain medication ordered  ----- Roberto Scales, [place]

## 2022-02-16 NOTE — Consult Note (Signed)
Medicine Note  Shauna Bodkins XVQ:[id number] DOB: Mar 12, 2001 DOA: 02/16/2022  PCP: Philip Aspen, CNM  Patient coming from: Home  I have personally briefly reviewed patient's old medical records in [place].  Chief Concern: Abdominal pain  HPI: Ms. Jessica Keith is a 21 year old female with history of obesity, recent spontaneous vaginal delivery on 0/03/3266, small umbilical hernia, presents emergency department for chief concerns of epigastric and right upper quadrant abdominal pain.  Initial vitals in the emergency department showed temperature of 98.6, respiration rate of 16, heart rate of 65, blood pressure of 125/86, SPO2 of 100% on room air.  Serum sodium is 137, potassium 4.0, chloride 107, bicarb 23, BUN of 12, serum creatinine of 0.78, GFR greater than 60, nonfasting blood glucose 86, WBC 8.9, hemoglobin 11.8, platelets of 316.  Alk phos was elevated at 211, AST 322, ALT 286.  Pregnancy was negative.  Lipase was negative at 37.  ED treatment: Tylenol 1 g p.o. and Maalox.  EDP consulted GI specialist, Dr. Marius Ditch who recommends to consult general surgery.  General surgeon was consulted by EDP and Dr. Hampton Abbot states no surgical indication at this time.  Patient requesting to stay overnight to observe to ensure that LFTs are downtrending.  At bedside patient is able to tell me her name, her age, she knows she is in the hospital.  She reports that she had a spontaneous delivery on 01/19/2022.  She states this was her first baby and it was full-term at 8 weeks.  She reports that she was bathing her newborn baby when she developed epigastric and right upper quadrant abdominal pain.  This pain was persistent and it improved yesterday in the emergency department.  However she woke up in the morning today and the pain reoccurred.  She denies any fever, nausea, vomiting, dysphagia, chest pain, shortness of breath, dysuria, hematuria, diarrhea, swelling of her legs and syncope.  She  denies seafood consumption.  She states she ate seafood.  She denies IV drug use.  She does not know that her partner had hepatitis B or C.  She reports she has never had pains like this before.  Social history: She denies tobacco, EtOH, recreational drug use  ROS: Constitutional: no weight change, no fever ENT/Mouth: no sore throat, no rhinorrhea Eyes: no eye pain, no vision changes Cardiovascular: no chest pain, no dyspnea,  no edema, no palpitations Respiratory: no cough, no sputum, no wheezing Gastrointestinal: no nausea, no vomiting, no diarrhea, no constipation Genitourinary: no urinary incontinence, no dysuria, no hematuria Musculoskeletal: no arthralgias, no myalgias Skin: no skin lesions, no pruritus, Neuro: + weakness, no loss of consciousness, no syncope Psych: no anxiety, no depression, no decrease appetite Heme/Lymph: no bruising, no bleeding  ED Course: Discussed with emergency medicine provider, patient requiring hospitalization for chief concerns of elevated LFTs.  Assessment/Plan  Principal Problem:   Elevated LFTs Active Problems:   Obesity (BMI 30-39.9)   Abdominal pain   Assessment and Plan:  * Elevated LFTs Query eclampsia, postpartum - Acute hepatitis panel ordered and pending collection - A.m. BMP, LFTs, CBC - Patient will be transferred to labor and delivery service under Dr. Marcelline Mates  -Transfer patient care to labor and delivery for IV magnesium  Abdominal pain - Negative for signs of cholangitis or choledocholithiasis at this time - Symptomatic support with as needed acetaminophen, Percocet, ondansetron as needed  Obesity (BMI 12-45.8) - This complicates overall care and prognosis.   Chart reviewed.   DVT prophylaxis: Enoxaparin Code Status: Full  code Diet: Regular diet Family Communication: No Disposition Plan: Pending clinical course Admission status: Labor and delivery  Past Medical History:  Diagnosis Date   Labor and delivery,  indication for care 01/17/2022   Medical history non-contributory    Umbilical hernia    Past Surgical History:  Procedure Laterality Date   ADENOIDECTOMY     Social History:  reports that she has never smoked. She has never used smokeless tobacco. She reports that she does not drink alcohol and does not use drugs.  No Known Allergies Family History  Problem Relation Age of Onset   Hypertension Mother    Hypertension Father    Cancer Maternal Grandfather        prostate   Family history: Family history reviewed and not pertinent  Prior to Admission medications   Medication Sig Start Date End Date Taking? Authorizing Provider  ibuprofen (ADVIL) 600 MG tablet Take 1 tablet (600 mg total) by mouth every 6 (six) hours. Patient taking differently: Take 600 mg by mouth every 6 (six) hours as needed for mild pain or moderate pain. 01/20/22  Yes Imagene Riches, CNM  Iron-FA-B Cmp-C-Biot-Probiotic (FUSION PLUS) CAPS Take 1 tablet by mouth daily. 10/29/21  Yes Swanson, Melissa M, CNM  pantoprazole (PROTONIX) 40 MG tablet Take 1 tablet (40 mg total) by mouth daily as needed. 02/16/22 02/16/23 Yes Ward, Delice Bison, DO  Prenatal Vit-Fe Fumarate-FA (PRENATAL PO) Take 1 tablet by mouth daily.   Yes [provider]  ondansetron (ZOFRAN-ODT) 4 MG disintegrating tablet Take 1 tablet (4 mg total) by mouth every 6 (six) hours as needed for nausea or vomiting. 02/16/22   Ward, Delice Bison, DO   Physical Exam: Vitals:   02/16/22 1322 02/16/22 1512 02/16/22 1707  BP: 125/86 110/69 112/77  Pulse: 65 62 69  Resp: '14 16 18  ' Temp: 98.6 F (37 C) 98.6 F (37 C) 98.6 F (37 C)  TempSrc: Oral Oral Oral  SpO2: 100% 97% 99%   Constitutional: appears age-appropriate, NAD, calm, comfortable Eyes: PERRL, lids and conjunctivae normal ENMT: Mucous membranes are moist. Posterior pharynx clear of any exudate or lesions. Age-appropriate dentition. Hearing appropriate Neck: normal, supple, no masses, no  thyromegaly Respiratory: clear to auscultation bilaterally, no wheezing, no crackles. Normal respiratory effort. No accessory muscle use.  Cardiovascular: Regular rate and rhythm, no murmurs / rubs / gallops. No extremity edema. 2+ pedal pulses. No carotid bruits.  Abdomen: obese abdomen, no tenderness, no masses palpated, no hepatosplenomegaly. Bowel sounds positive.  Musculoskeletal: no clubbing / cyanosis. No joint deformity upper and lower extremities. Good ROM, no contractures, no atrophy. Normal muscle tone.  Skin: no rashes, lesions, ulcers. No induration Neurologic: Sensation intact. Strength 5/5 in all 4.  Psychiatric: Normal judgment and insight. Alert and oriented x 3. Normal mood.   EKG: independently reviewed, showing sinus rhythm with rate of 89, QTc 441 on EKG obtained on 02/15/2022 at approximately 11 PM  Chest x-ray on Admission: I personally reviewed and I agree with radiologist reading as below.  MR ABDOMEN MRCP W WO CONTAST  Result Date: 02/16/2022 CLINICAL DATA:  Abdominal pain.  Abnormal liver function tests. EXAM: MRI ABDOMEN WITHOUT AND WITH CONTRAST (INCLUDING MRCP) TECHNIQUE: Multiplanar multisequence MR imaging of the abdomen was performed both before and after the administration of intravenous contrast. Heavily T2-weighted images of the biliary and pancreatic ducts were obtained, and three-dimensional MRCP images were rendered by post processing. CONTRAST:  15m GADAVIST GADOBUTROL 1 MMOL/ML IV SOLN COMPARISON:  None Available. FINDINGS: Lower chest: No acute findings. Hepatobiliary: No hepatic masses identified. No evidence of hepatic steatosis. Gallbladder is unremarkable. No evidence of biliary ductal dilatation. Pancreas: No mass or inflammatory changes. No evidence of pancreatic ductal dilatation or pancreas divisum. Spleen:  Within normal limits in size and appearance. Adrenals/Urinary Tract: No masses identified. No evidence of hydronephrosis. Stomach/Bowel:  Unremarkable. Vascular/Lymphatic: No pathologically enlarged lymph nodes identified. No acute vascular findings. Other:  Small umbilical hernia is seen, which contains only fat. Musculoskeletal:  No suspicious bone lesions identified. IMPRESSION: No acute findings. Small umbilical hernia, which contains only fat. Electronically Signed   By: Marlaine Hind M.D.   On: 02/16/2022 17:21   MR 3D Recon At Scanner  Result Date: 02/16/2022 CLINICAL DATA:  Abdominal pain.  Abnormal liver function tests. EXAM: MRI ABDOMEN WITHOUT AND WITH CONTRAST (INCLUDING MRCP) TECHNIQUE: Multiplanar multisequence MR imaging of the abdomen was performed both before and after the administration of intravenous contrast. Heavily T2-weighted images of the biliary and pancreatic ducts were obtained, and three-dimensional MRCP images were rendered by post processing. CONTRAST:  13m GADAVIST GADOBUTROL 1 MMOL/ML IV SOLN COMPARISON:  None Available. FINDINGS: Lower chest: No acute findings. Hepatobiliary: No hepatic masses identified. No evidence of hepatic steatosis. Gallbladder is unremarkable. No evidence of biliary ductal dilatation. Pancreas: No mass or inflammatory changes. No evidence of pancreatic ductal dilatation or pancreas divisum. Spleen:  Within normal limits in size and appearance. Adrenals/Urinary Tract: No masses identified. No evidence of hydronephrosis. Stomach/Bowel: Unremarkable. Vascular/Lymphatic: No pathologically enlarged lymph nodes identified. No acute vascular findings. Other:  Small umbilical hernia is seen, which contains only fat. Musculoskeletal:  No suspicious bone lesions identified. IMPRESSION: No acute findings. Small umbilical hernia, which contains only fat. Electronically Signed   By: JMarlaine HindM.D.   On: 02/16/2022 17:21   CT Angio Chest PE W and/or Wo Contrast  Result Date: 02/16/2022 CLINICAL DATA:  Chest and abdominal pain EXAM: CT ANGIOGRAPHY CHEST CT ABDOMEN AND PELVIS WITH CONTRAST TECHNIQUE:  Multidetector CT imaging of the chest was performed using the standard protocol during bolus administration of intravenous contrast. Multiplanar CT image reconstructions and MIPs were obtained to evaluate the vascular anatomy. Multidetector CT imaging of the abdomen and pelvis was performed using the standard protocol during bolus administration of intravenous contrast. RADIATION DOSE REDUCTION: This exam was performed according to the departmental dose-optimization program which includes automated exposure control, adjustment of the mA and/or kV according to patient size and/or use of iterative reconstruction technique. CONTRAST:  1079mOMNIPAQUE IOHEXOL 350 MG/ML SOLN COMPARISON:  None Available. FINDINGS: CTA CHEST FINDINGS Cardiovascular: Thoracic aorta shows a normal branching pattern. No aneurysmal dilatation or dissection is noted. No cardiac enlargement is seen. The pulmonary artery shows a normal branching pattern bilaterally. No filling defects to suggest pulmonary embolism are noted. Mediastinum/Nodes: Thoracic inlet is within normal limits. No sizable hilar or mediastinal adenopathy is noted. Residual thymus tissue is noted in the anterior mediastinum. The esophagus as visualized is within normal limits. Lungs/Pleura: Lungs are well aerated bilaterally. No focal infiltrate or sizable effusion is noted. Musculoskeletal: No acute rib abnormality is noted. No acute abnormality in the thoracic spine is seen. Breasts are engorged consistent with the recent postpartum state. Review of the MIP images confirms the above findings. CT ABDOMEN and PELVIS FINDINGS Hepatobiliary: No focal liver abnormality is seen. No gallstones, gallbladder wall thickening, or biliary dilatation. Pancreas: Unremarkable. No pancreatic ductal dilatation or surrounding inflammatory changes. Spleen: Normal in size without  focal abnormality. Adrenals/Urinary Tract: Adrenal glands are within normal limits. Kidneys are well visualized  bilaterally. No renal calculi or obstructive changes are seen. Mild rotation defect is noted within the right kidney although no obstructive changes are seen. The bladder is partially distended. Stomach/Bowel: No obstructive or inflammatory changes of the colon are seen. The appendix is within normal limits. Small bowel and stomach are unremarkable. Vascular/Lymphatic: No significant vascular findings are present. No enlarged abdominal or pelvic lymph nodes. Reproductive: Uterus and bilateral adnexa are unremarkable. Other: Small fat containing supraumbilical hernia is noted. Musculoskeletal: No acute or significant osseous findings. Review of the MIP images confirms the above findings. IMPRESSION: CTA of the chest: No evidence of pulmonary emboli. No acute abnormality seen. CT of the abdomen and pelvis: Small supraumbilical fat containing hernia. No other focal abnormality is seen. Electronically Signed   By: Inez Catalina M.D.   On: 02/16/2022 01:02   CT ABDOMEN PELVIS W CONTRAST  Result Date: 02/16/2022 CLINICAL DATA:  Chest and abdominal pain EXAM: CT ANGIOGRAPHY CHEST CT ABDOMEN AND PELVIS WITH CONTRAST TECHNIQUE: Multidetector CT imaging of the chest was performed using the standard protocol during bolus administration of intravenous contrast. Multiplanar CT image reconstructions and MIPs were obtained to evaluate the vascular anatomy. Multidetector CT imaging of the abdomen and pelvis was performed using the standard protocol during bolus administration of intravenous contrast. RADIATION DOSE REDUCTION: This exam was performed according to the departmental dose-optimization program which includes automated exposure control, adjustment of the mA and/or kV according to patient size and/or use of iterative reconstruction technique. CONTRAST:  129m OMNIPAQUE IOHEXOL 350 MG/ML SOLN COMPARISON:  None Available. FINDINGS: CTA CHEST FINDINGS Cardiovascular: Thoracic aorta shows a normal branching pattern. No  aneurysmal dilatation or dissection is noted. No cardiac enlargement is seen. The pulmonary artery shows a normal branching pattern bilaterally. No filling defects to suggest pulmonary embolism are noted. Mediastinum/Nodes: Thoracic inlet is within normal limits. No sizable hilar or mediastinal adenopathy is noted. Residual thymus tissue is noted in the anterior mediastinum. The esophagus as visualized is within normal limits. Lungs/Pleura: Lungs are well aerated bilaterally. No focal infiltrate or sizable effusion is noted. Musculoskeletal: No acute rib abnormality is noted. No acute abnormality in the thoracic spine is seen. Breasts are engorged consistent with the recent postpartum state. Review of the MIP images confirms the above findings. CT ABDOMEN and PELVIS FINDINGS Hepatobiliary: No focal liver abnormality is seen. No gallstones, gallbladder wall thickening, or biliary dilatation. Pancreas: Unremarkable. No pancreatic ductal dilatation or surrounding inflammatory changes. Spleen: Normal in size without focal abnormality. Adrenals/Urinary Tract: Adrenal glands are within normal limits. Kidneys are well visualized bilaterally. No renal calculi or obstructive changes are seen. Mild rotation defect is noted within the right kidney although no obstructive changes are seen. The bladder is partially distended. Stomach/Bowel: No obstructive or inflammatory changes of the colon are seen. The appendix is within normal limits. Small bowel and stomach are unremarkable. Vascular/Lymphatic: No significant vascular findings are present. No enlarged abdominal or pelvic lymph nodes. Reproductive: Uterus and bilateral adnexa are unremarkable. Other: Small fat containing supraumbilical hernia is noted. Musculoskeletal: No acute or significant osseous findings. Review of the MIP images confirms the above findings. IMPRESSION: CTA of the chest: No evidence of pulmonary emboli. No acute abnormality seen. CT of the abdomen and  pelvis: Small supraumbilical fat containing hernia. No other focal abnormality is seen. Electronically Signed   By: MInez CatalinaM.D.   On: 02/16/2022 01:02  US ABDOMEN LIMITED RUQ (LIVER/GB)  Result Date: 02/15/2022 CLINICAL DATA:  Right upper quadrant abdominal pain for 2 days. EXAM: ULTRASOUND ABDOMEN LIMITED RIGHT UPPER QUADRANT COMPARISON:  None Available. FINDINGS: Gallbladder: No gallstones or wall thickening visualized. No sonographic Murphy sign noted by sonographer. Common bile duct: Diameter: 5 mm, normal Liver: No focal lesion identified. Within normal limits in parenchymal echogenicity. Portal vein is patent on color Doppler imaging with normal direction of blood flow towards the liver. Other: None. IMPRESSION: No evidence of cholelithiasis or acute cholecystitis. Electronically Signed   By: Lucienne Capers M.D.   On: 02/15/2022 23:36    Labs on Admission: I have personally reviewed following labs  CBC: Recent Labs  Lab 02/16/22 0026 02/16/22 1345  WBC 14.4* 8.9  NEUTROABS 10.4*  --   HGB 11.2* 11.8*  HCT 35.5* 37.3  MCV 86.0 85.2  PLT 319 423   Basic Metabolic Panel: Recent Labs  Lab 02/15/22 2219 02/16/22 1345  NA 137 137  K 4.1 4.0  CL 111 107  CO2 21* 23  GLUCOSE 92 86  BUN 18 12  CREATININE 0.79 0.78  CALCIUM 8.6* 9.0   GFR: Estimated Creatinine Clearance: 120.9 mL/min (by C-G formula based on SCr of 0.78 mg/dL).  Liver Function Tests: Recent Labs  Lab 02/15/22 2219 02/16/22 1345  AST 44* 322*  ALT 24 286*  ALKPHOS 123 211*  BILITOT 0.7 2.1*  PROT 7.2 7.8  ALBUMIN 3.7 4.0   Recent Labs  Lab 02/15/22 2219 02/16/22 1345  LIPASE 33 37   Coagulation Profile: Recent Labs  Lab 02/16/22 1700  INR 1.1   Urine analysis:    Component Value Date/Time   COLORURINE AMBER (A) 02/16/2022 1325   APPEARANCEUR HAZY (A) 02/16/2022 1325   APPEARANCEUR CANCELED 06/17/2021 1135   LABSPEC 1.032 (H) 02/16/2022 1325   PHURINE 6.0 02/16/2022 1325    GLUCOSEU NEGATIVE 02/16/2022 1325   HGBUR LARGE (A) 02/16/2022 1325   BILIRUBINUR NEGATIVE 02/16/2022 1325   BILIRUBINUR neg 01/16/2022 1536   BILIRUBINUR CANCELED 06/17/2021 1135   KETONESUR NEGATIVE 02/16/2022 1325   PROTEINUR NEGATIVE 02/16/2022 1325   UROBILINOGEN 0.2 01/16/2022 1536   NITRITE NEGATIVE 02/16/2022 1325   LEUKOCYTESUR SMALL (A) 02/16/2022 1325   Dr. Tobie Poet [facility]  If 7PM-7AM, please contact overnight-coverage provider If 7AM-7PM, please contact day coverage provider [email or web address]  02/16/2022, 7:06 PM

## 2022-02-16 NOTE — Assessment & Plan Note (Addendum)
Query eclampsia, postpartum - Acute hepatitis panel ordered and pending collection - A.m. BMP, LFTs, CBC - Patient will be transferred to labor and delivery service under Dr. Marcelline Mates  -Transfer patient care to labor and delivery for IV magnesium

## 2022-02-16 NOTE — Discharge Instructions (Addendum)
You may alternate Tylenol 1000 mg every 6 hours as needed for pain, fever and Ibuprofen 800 mg every 6-8 hours as needed for pain, fever.  Please take Ibuprofen with food.  Do not take more than 4000 mg of Tylenol (acetaminophen) in a 24 hour period.  You did not receive any medications here that are not safe to continue breast-feeding.  You do not need to pump and dump.  Your blood work was reassuring other than showing an elevated white blood cell count which you have had previously.  Your liver test, pancreas, kidney function was normal.  Urine did show blood likely from her menstrual cycle.  CT scans of your chest, abdomen and ultrasound of your gallbladder were normal today.  Your blood pressure was also normal making preeclampsia much less likely but you are still at risk for preeclampsia 6 weeks after delivery.  If you develop blood pressures that are persistently over 140/90, severe headache, vision changes, confusion, return of the right upper abdominal pain, vomiting, please return to the emergency department.

## 2022-02-16 NOTE — ED Provider Notes (Signed)
[facility] Provider Note    Event Date/Time   First MD Initiated Contact with Patient 02/16/22 0007     (approximate)   History   Abdominal Pain   HPI  Jessica Keith is a 21 y.o. female with no significant past medical history who presents to the emergency department with upper right-sided abdominal pain and right lower chest pain with difficulty breathing that started yesterday.  Pain initially started in her mid anterior chest but is now in the right lower chest and right upper quadrant.  No aggravating or alleviating factors.  No fevers, cough, nausea, vomiting, diarrhea, dysuria, hematuria, abnormal vaginal bleeding or discharge.  She is currently on her menstrual cycle.  She did have a spontaneous vaginal delivery on 01/19/2022.  She is breast-feeding.  Symptoms worsened tonight while she was giving her child a bath.  States she ate a [employer] sub for dinner tonight.   History provided by patient.    Past Medical History:  Diagnosis Date   Labor and delivery, indication for care 01/17/2022   Medical history non-contributory    Umbilical hernia     Past Surgical History:  Procedure Laterality Date   ADENOIDECTOMY      MEDICATIONS:  Prior to Admission medications   Medication Sig Start Date End Date Taking? Authorizing Provider  ibuprofen (ADVIL) 600 MG tablet Take 1 tablet (600 mg total) by mouth every 6 (six) hours. 01/20/22   Imagene Riches, CNM  Iron-FA-B Cmp-C-Biot-Probiotic (FUSION PLUS) CAPS Take 1 tablet by mouth daily. 10/29/21   Lurlean Horns, CNM  Prenatal Vit-Fe Fumarate-FA (PRENATAL PO) Take 1 tablet by mouth daily.    [provider]    Physical Exam   Triage Vital Signs: ED Triage Vitals  Enc Vitals Group     BP 02/15/22 2216 116/87     Pulse Rate 02/15/22 2216 80     Resp 02/15/22 2216 18     Temp 02/15/22 2216 98.5 F (36.9 C)     Temp Source 02/15/22 2216 Oral     SpO2 02/15/22 2216 100 %     Weight  02/15/22 2216 203 lb (92.1 kg)     Height 02/15/22 2216 '5\' 3"'$  (1.6 m)     Head Circumference --      Peak Flow --      Pain Score 02/15/22 2222 7     Pain Loc --      Pain Edu? --      Excl. in [place]? --     Most recent vital signs: Vitals:   02/16/22 0100 02/16/22 0130  BP: 107/70 (!) 121/92  Pulse: 73 65  Resp: 20 17  Temp:    SpO2: 100% 100%    CONSTITUTIONAL: Alert and oriented and responds appropriately to questions.  Appears uncomfortable, rocking back and forth in pain HEAD: Normocephalic, atraumatic EYES: Conjunctivae clear, pupils appear equal, sclera nonicteric ENT: normal nose; moist mucous membranes NECK: Supple, normal ROM CARD: RRR; S1 and S2 appreciated; no murmurs, no clicks, no rubs, no gallops RESP: Normal chest excursion without splinting or tachypnea; breath sounds clear and equal bilaterally; no wheezes, no rhonchi, no rales, no hypoxia or respiratory distress, speaking full sentences ABD/GI: Normal bowel sounds; non-distended; soft, tender in the upper abdomen mostly on the right side without guarding or rebound BACK: The back appears normal EXT: Normal ROM in all joints; no deformity noted, no edema; no cyanosis SKIN: Normal color for age and race; warm; no  rash on exposed skin NEURO: Moves all extremities equally, normal speech, no facial asymmetry, 2+ deep tendon reflexes in bilateral upper and lower extremities without hyperreflexia PSYCH: The patient's mood and manner are appropriate.   ED Results / Procedures / Treatments   LABS: (all labs ordered are listed, but only abnormal results are displayed) Labs Reviewed  COMPREHENSIVE METABOLIC PANEL - Abnormal; Notable for the following components:      Result Value   CO2 21 (*)    Calcium 8.6 (*)    AST 44 (*)    All other components within normal limits  URINALYSIS, ROUTINE W REFLEX MICROSCOPIC - Abnormal; Notable for the following components:   Color, Urine YELLOW (*)    APPearance HAZY (*)     Hgb urine dipstick LARGE (*)    Leukocytes,Ua LARGE (*)    RBC / HPF >50 (*)    Bacteria, UA RARE (*)    All other components within normal limits  CBC WITH DIFFERENTIAL/PLATELET - Abnormal; Notable for the following components:   WBC 14.4 (*)    Hemoglobin 11.2 (*)    HCT 35.5 (*)    Neutro Abs 10.4 (*)    All other components within normal limits  LIPASE, BLOOD  POC URINE PREG, ED     EKG:  EKG Interpretation  Date/Time:  Saturday February 15 2022 22:20:58 EDT Ventricular Rate:  72 PR Interval:  140 QRS Duration: 86 QT Interval:  370 QTC Calculation: 405 R Axis:   60 Text Interpretation: Normal sinus rhythm Normal ECG When compared with ECG of 03-Dec-2021 22:36, No significant change was found Confirmed by Pryor Curia [phone]) on 02/16/2022 12:39:49 AM         RADIOLOGY: My personal review and interpretation of imaging: Ultrasound shows no gallstones.  CT of the chest shows no PE.  CT abdomen pelvis shows no acute abnormality.  I have personally reviewed all radiology reports.   CT Angio Chest PE W and/or Wo Contrast  Result Date: 02/16/2022 CLINICAL DATA:  Chest and abdominal pain EXAM: CT ANGIOGRAPHY CHEST CT ABDOMEN AND PELVIS WITH CONTRAST TECHNIQUE: Multidetector CT imaging of the chest was performed using the standard protocol during bolus administration of intravenous contrast. Multiplanar CT image reconstructions and MIPs were obtained to evaluate the vascular anatomy. Multidetector CT imaging of the abdomen and pelvis was performed using the standard protocol during bolus administration of intravenous contrast. RADIATION DOSE REDUCTION: This exam was performed according to the departmental dose-optimization program which includes automated exposure control, adjustment of the mA and/or kV according to patient size and/or use of iterative reconstruction technique. CONTRAST:  128m OMNIPAQUE IOHEXOL 350 MG/ML SOLN COMPARISON:  None Available. FINDINGS: CTA CHEST FINDINGS  Cardiovascular: Thoracic aorta shows a normal branching pattern. No aneurysmal dilatation or dissection is noted. No cardiac enlargement is seen. The pulmonary artery shows a normal branching pattern bilaterally. No filling defects to suggest pulmonary embolism are noted. Mediastinum/Nodes: Thoracic inlet is within normal limits. No sizable hilar or mediastinal adenopathy is noted. Residual thymus tissue is noted in the anterior mediastinum. The esophagus as visualized is within normal limits. Lungs/Pleura: Lungs are well aerated bilaterally. No focal infiltrate or sizable effusion is noted. Musculoskeletal: No acute rib abnormality is noted. No acute abnormality in the thoracic spine is seen. Breasts are engorged consistent with the recent postpartum state. Review of the MIP images confirms the above findings. CT ABDOMEN and PELVIS FINDINGS Hepatobiliary: No focal liver abnormality is seen. No gallstones, gallbladder wall thickening, or  biliary dilatation. Pancreas: Unremarkable. No pancreatic ductal dilatation or surrounding inflammatory changes. Spleen: Normal in size without focal abnormality. Adrenals/Urinary Tract: Adrenal glands are within normal limits. Kidneys are well visualized bilaterally. No renal calculi or obstructive changes are seen. Mild rotation defect is noted within the right kidney although no obstructive changes are seen. The bladder is partially distended. Stomach/Bowel: No obstructive or inflammatory changes of the colon are seen. The appendix is within normal limits. Small bowel and stomach are unremarkable. Vascular/Lymphatic: No significant vascular findings are present. No enlarged abdominal or pelvic lymph nodes. Reproductive: Uterus and bilateral adnexa are unremarkable. Other: Small fat containing supraumbilical hernia is noted. Musculoskeletal: No acute or significant osseous findings. Review of the MIP images confirms the above findings. IMPRESSION: CTA of the chest: No evidence of  pulmonary emboli. No acute abnormality seen. CT of the abdomen and pelvis: Small supraumbilical fat containing hernia. No other focal abnormality is seen. Electronically Signed   By: Inez Catalina M.D.   On: 02/16/2022 01:02   CT ABDOMEN PELVIS W CONTRAST  Result Date: 02/16/2022 CLINICAL DATA:  Chest and abdominal pain EXAM: CT ANGIOGRAPHY CHEST CT ABDOMEN AND PELVIS WITH CONTRAST TECHNIQUE: Multidetector CT imaging of the chest was performed using the standard protocol during bolus administration of intravenous contrast. Multiplanar CT image reconstructions and MIPs were obtained to evaluate the vascular anatomy. Multidetector CT imaging of the abdomen and pelvis was performed using the standard protocol during bolus administration of intravenous contrast. RADIATION DOSE REDUCTION: This exam was performed according to the departmental dose-optimization program which includes automated exposure control, adjustment of the mA and/or kV according to patient size and/or use of iterative reconstruction technique. CONTRAST:  145m OMNIPAQUE IOHEXOL 350 MG/ML SOLN COMPARISON:  None Available. FINDINGS: CTA CHEST FINDINGS Cardiovascular: Thoracic aorta shows a normal branching pattern. No aneurysmal dilatation or dissection is noted. No cardiac enlargement is seen. The pulmonary artery shows a normal branching pattern bilaterally. No filling defects to suggest pulmonary embolism are noted. Mediastinum/Nodes: Thoracic inlet is within normal limits. No sizable hilar or mediastinal adenopathy is noted. Residual thymus tissue is noted in the anterior mediastinum. The esophagus as visualized is within normal limits. Lungs/Pleura: Lungs are well aerated bilaterally. No focal infiltrate or sizable effusion is noted. Musculoskeletal: No acute rib abnormality is noted. No acute abnormality in the thoracic spine is seen. Breasts are engorged consistent with the recent postpartum state. Review of the MIP images confirms the above  findings. CT ABDOMEN and PELVIS FINDINGS Hepatobiliary: No focal liver abnormality is seen. No gallstones, gallbladder wall thickening, or biliary dilatation. Pancreas: Unremarkable. No pancreatic ductal dilatation or surrounding inflammatory changes. Spleen: Normal in size without focal abnormality. Adrenals/Urinary Tract: Adrenal glands are within normal limits. Kidneys are well visualized bilaterally. No renal calculi or obstructive changes are seen. Mild rotation defect is noted within the right kidney although no obstructive changes are seen. The bladder is partially distended. Stomach/Bowel: No obstructive or inflammatory changes of the colon are seen. The appendix is within normal limits. Small bowel and stomach are unremarkable. Vascular/Lymphatic: No significant vascular findings are present. No enlarged abdominal or pelvic lymph nodes. Reproductive: Uterus and bilateral adnexa are unremarkable. Other: Small fat containing supraumbilical hernia is noted. Musculoskeletal: No acute or significant osseous findings. Review of the MIP images confirms the above findings. IMPRESSION: CTA of the chest: No evidence of pulmonary emboli. No acute abnormality seen. CT of the abdomen and pelvis: Small supraumbilical fat containing hernia. No other focal abnormality is seen.  Electronically Signed   By: Inez Catalina M.D.   On: 02/16/2022 01:02   US ABDOMEN LIMITED RUQ (LIVER/GB)  Result Date: 02/15/2022 CLINICAL DATA:  Right upper quadrant abdominal pain for 2 days. EXAM: ULTRASOUND ABDOMEN LIMITED RIGHT UPPER QUADRANT COMPARISON:  None Available. FINDINGS: Gallbladder: No gallstones or wall thickening visualized. No sonographic Murphy sign noted by sonographer. Common bile duct: Diameter: 5 mm, normal Liver: No focal lesion identified. Within normal limits in parenchymal echogenicity. Portal vein is patent on color Doppler imaging with normal direction of blood flow towards the liver. Other: None. IMPRESSION: No  evidence of cholelithiasis or acute cholecystitis. Electronically Signed   By: Lucienne Capers M.D.   On: 02/15/2022 23:36     PROCEDURES:  Critical Care performed: No      .1-3 Lead EKG Interpretation  Performed by: Leavy Heatherly, Delice Bison, DO Authorized by: Alantis Bethune, Delice Bison, DO     Interpretation: normal     ECG rate:  88   ECG rate assessment: normal     Rhythm: sinus rhythm     Ectopy: none     Conduction: normal       IMPRESSION / MDM / ASSESSMENT AND PLAN / ED COURSE  I reviewed the triage vital signs and the nursing notes.    Patient here complaints of chest pain, abdominal pain.  The patient is on the cardiac monitor to evaluate for evidence of arrhythmia and/or significant heart rate changes.   DIFFERENTIAL DIAGNOSIS (includes but not limited to):   Cholelithiasis, cholecystitis, pancreatitis, cholangitis, choledocholithiasis, gastritis, GERD, PE, pneumonia, less likely ACS or dissection, appendicitis   Patient's presentation is most consistent with acute presentation with potential threat to life or bodily function.   PLAN: We will obtain CBC, CMP, lipase, right upper quadrant ultrasound, urinalysis.  EKG nonischemic.  Will give pain and nausea medicine.   MEDICATIONS GIVEN IN ED: Medications  morphine (PF) 4 MG/ML injection 4 mg (4 mg Intravenous Given 02/16/22 0028)  ondansetron (ZOFRAN) injection 4 mg (4 mg Intravenous Given 02/16/22 0027)  sodium chloride 0.9 % bolus 1,000 mL (0 mLs Intravenous Stopped 02/16/22 0120)  pantoprazole (PROTONIX) injection 40 mg (40 mg Intravenous Given 02/16/22 0027)  iohexol (OMNIPAQUE) 350 MG/ML injection 100 mL (100 mLs Intravenous Contrast Given 02/16/22 0043)  alum & mag hydroxide-simeth (MAALOX/MYLANTA) 200-200-20 MG/5ML suspension 30 mL (30 mLs Oral Given 02/16/22 0125)     ED COURSE: Patient's labs show leukocytosis of 14,000 which she appears to have had previously.  Normal electrolytes.  AST mildly elevated but otherwise  normal LFTs and lipase.  Urine shows blood but she is on her menstrual cycle and has no urinary symptoms.  Pregnancy test negative.  Right upper quadrant ultrasound reviewed and interpreted by myself and the radiologist and shows no acute abnormality.  We will proceed with CTA of the chest and CT of the abdomen pelvis.   CT scans reviewed and interpreted by myself as well as the radiologist and show no acute abnormality.  No PE, infiltrate, edema.  Bowel appears normal.  Appendix is normal.  Uterus and adnexa are normal.  She reports feeling much better and is very comfortable on exam and no longer rocking back and forth or moaning in pain.  She has tolerated p.o. here.  Low suspicion for preeclampsia.  She states she did have intermittent high blood pressures during pregnancy but was not diagnosed with preeclampsia.  She has no headache, vision changes.  Her reflexes are normal.  She does  not have any protein in her urine today.  Her blood pressures here are normal.  Discussed with patient I feel she is safe for discharge home.  This could be acid reflux that caused her symptoms.  Will discharge with a prescription of Protonix and Zofran.  Discussed with her she can use Tylenol, ibuprofen.  Recommended close follow-up if symptoms continue.  Discussed usual and custom return precautions.  She verbalized understanding and is comfortable with this plan.  CONSULTS: No emergent OB/GYN consult needed at this time.  Doubt preeclampsia.  Work-up has been reassuring and she is feeling better, tolerating p.o.   OUTSIDE RECORDS REVIEWED: Reviewed patient's delivery note on 01/19/2022.       FINAL CLINICAL IMPRESSION(S) / ED DIAGNOSES   Final diagnoses:  Right-sided chest pain  RUQ abdominal pain     Rx / DC Orders   ED Discharge Orders          Ordered    ondansetron (ZOFRAN-ODT) 4 MG disintegrating tablet  Every 6 hours PRN        02/16/22 0132    pantoprazole (PROTONIX) 40 MG tablet  Daily PRN         02/16/22 0132             Note:  This document was prepared using Dragon voice recognition software and may include unintentional dictation errors.   Tali Cleaves, Delice Bison, DO 02/16/22 [phone]

## 2022-02-16 NOTE — Assessment & Plan Note (Signed)
-   This complicates overall care and prognosis.  

## 2022-02-16 NOTE — ED Triage Notes (Signed)
Pt presents via POV c/o abd pain x3 days. Reports seen here in ED with improvement in pain. Reports abd pain has restarted about the same as yesterday when seen in ED. Ambulatory to triage.

## 2022-02-16 NOTE — ED Provider Notes (Addendum)
[facility] Provider Note    Event Date/Time   First MD Initiated Contact with Patient 02/16/22 1342     (approximate)   History   Abdominal Pain   HPI  Jessica Keith is a 21 y.o. female overall healthy who comes in with concern for right-sided abdominal pain.  Patient is currently menstruating.  I reviewed where patient was seen yesterday in the emergency room and early this morning had a CT scan of her chest abdomen pelvis that was reassuring.  She did have a spontaneous vaginal delivery on 01/19/2022.  Patient reports that she had felt well when she was discharged early this morning and then went home and then woke back up and had the same epigastric and right upper quadrant pain.  She reports taking the Protonix and Zofran but the pain was still continuing therefore she came to the ER to be evaluated.  She denies any vomiting, diarrhea.   Physical Exam   Triage Vital Signs: ED Triage Vitals [02/16/22 1322]  Enc Vitals Group     BP 125/86     Pulse Rate 65     Resp 14     Temp 98.6 F (37 C)     Temp Source Oral     SpO2 100 %     Weight      Height      Head Circumference      Peak Flow      Pain Score 6     Pain Loc      Pain Edu?      Excl. in [place]?     Most recent vital signs: Vitals:   02/16/22 1322  BP: 125/86  Pulse: 65  Resp: 14  Temp: 98.6 F (37 C)  SpO2: 100%     General: Awake, no distress.  CV:  Good peripheral perfusion.  Resp:  Normal effort.  Abd:  No distention.  Epigastric tenderness, right upper quadrant tenderness Other:     ED Results / Procedures / Treatments   Labs (all labs ordered are listed, but only abnormal results are displayed) Labs Reviewed  LIPASE, BLOOD  COMPREHENSIVE METABOLIC PANEL  CBC  URINALYSIS, ROUTINE W REFLEX MICROSCOPIC  POC URINE PREG, ED    RADIOLOGY I have reviewed the MRCP personally and interpreted and do not see evidence of gallstones  PROCEDURES:  Critical Care  performed: No  Procedures   MEDICATIONS ORDERED IN ED: Medications - No data to display   IMPRESSION / MDM / [place] / ED COURSE  I reviewed the triage vital signs and the nursing notes.   Patient's presentation is most consistent with acute presentation with potential threat to life or bodily function.   Differential includes gastritis, ulcer, gallstones.  CT imaging evaluating for pulmonary embolism, obstruction, perforation were all reassuring less than 24 hours ago.  Given that the CT scan miss a percentage of gallstones will do ultrasound to further evaluate for that.  However I did discuss with patient that this could be gastritis or ulcer which would not be able to be seen on imaging.  Pregnancy test is negative.  Lipase normal.  LFTs now show elevated T. bili, alk phos AST and ALT.  CBC shows downtrending white count.  MRI is reassuring.  Discussed with Dr. Marius Ditch who recommended discussing with surgery to make sure no evidence of a calculus cholecystitis.  Discussed with Dr. Hampton Abbot who given negative MRCP does not feel like this is surgical in  nature.  Patient's pain is getting better now but given she recently bounced back with return of pain we discussed going home versus admission for trending LFTs.  She greatly preferred admission.  We will add on hepatitis panel.  I considered the possibility of HELLP syndrome given recent pregnancy but her blood pressures are normal and there is no evidence of preeclampsia. UA no protein. Normal Plts.  Hemoglobin slightly low but stable from her pregnancy.  I discussed with the OB/GYN team Roberto Scales who agreed given no other findings unlikely to be help syndrome.  We will discuss with hospital team for admission for trending out her LFTs and for further work-up and management.  To note patient denied any significant Tylenol use, alcohol use or IV drug use   I again discussed with OB team Alma Friendly who had talked to Dr. Marcelline Mates  who now recommends that patient instead be admitted to the OB/GYN team for mag in case this is a rare presentation of help syndrome. They will order the mag and did not think I needed to order.  FINAL CLINICAL IMPRESSION(S) / ED DIAGNOSES   Final diagnoses:  Right upper quadrant abdominal pain  Abnormal LFTs     Rx / DC Orders   ED Discharge Orders     None        Note:  This document was prepared using Dragon voice recognition software and may include unintentional dictation errors.   Vanessa [place], MD 02/16/22 1826    Vanessa [place], MD 02/16/22 1859    Vanessa [place], MD 02/16/22 Drema Halon

## 2022-02-16 NOTE — Assessment & Plan Note (Signed)
-   Negative for signs of cholangitis or choledocholithiasis at this time - Symptomatic support with as needed acetaminophen, Percocet, ondansetron as needed

## 2022-02-17 DIAGNOSIS — E669 Obesity, unspecified: Secondary | ICD-10-CM | POA: Diagnosis not present

## 2022-02-17 DIAGNOSIS — R748 Abnormal levels of other serum enzymes: Secondary | ICD-10-CM | POA: Diagnosis present

## 2022-02-17 DIAGNOSIS — R109 Unspecified abdominal pain: Secondary | ICD-10-CM | POA: Diagnosis not present

## 2022-02-17 DIAGNOSIS — O99215 Obesity complicating the puerperium: Secondary | ICD-10-CM | POA: Diagnosis not present

## 2022-02-17 DIAGNOSIS — R7989 Other specified abnormal findings of blood chemistry: Secondary | ICD-10-CM | POA: Diagnosis not present

## 2022-02-17 DIAGNOSIS — O9089 Other complications of the puerperium, not elsewhere classified: Secondary | ICD-10-CM | POA: Diagnosis not present

## 2022-02-17 LAB — BASIC METABOLIC PANEL
Anion gap: 7 (ref 5–15)
BUN: 14 mg/dL (ref 6–20)
CO2: 22 mmol/L (ref 22–32)
Calcium: 7.4 mg/dL — ABNORMAL LOW (ref 8.9–10.3)
Chloride: 110 mmol/L (ref 98–111)
Creatinine, Ser: 0.88 mg/dL (ref 0.44–1.00)
GFR, Estimated: >60 mL/min (ref >60)
Glucose, Bld: 93 mg/dL (ref 70–99)
Potassium: 4.1 mmol/L (ref 3.5–5.1)
Sodium: 139 mmol/L (ref 135–145)

## 2022-02-17 LAB — HEPATIC FUNCTION PANEL
ALT: 241 U/L — ABNORMAL HIGH (ref 0–44)
ALT: 200 U/L — ABNORMAL HIGH (ref 0–44)
AST: 158 U/L — ABNORMAL HIGH (ref 15–41)
AST: 98 U/L — ABNORMAL HIGH (ref 15–41)
Albumin: 3.5 g/dL (ref 3.5–5.0)
Albumin: 3.4 g/dL — ABNORMAL LOW (ref 3.5–5.0)
Alkaline Phosphatase: 214 U/L — ABNORMAL HIGH (ref 38–126)
Alkaline Phosphatase: 188 U/L — ABNORMAL HIGH (ref 38–126)
Bilirubin, Direct: 0.1 mg/dL (ref 0.0–0.2)
Bilirubin, Direct: 0.1 mg/dL (ref 0.0–0.2)
Indirect Bilirubin: 0.5 mg/dL (ref 0.3–0.9)
Indirect Bilirubin: 0.5 mg/dL (ref 0.3–0.9)
Total Bilirubin: 0.6 mg/dL (ref 0.3–1.2)
Total Bilirubin: 0.6 mg/dL (ref 0.3–1.2)
Total Protein: 7 g/dL (ref 6.5–8.1)
Total Protein: 6.8 g/dL (ref 6.5–8.1)

## 2022-02-17 LAB — MAGNESIUM: Magnesium: 6.5 mg/dL (ref 1.7–2.4)

## 2022-02-17 LAB — CBC
HCT: 35 % — ABNORMAL LOW (ref 36.0–46.0)
Hemoglobin: 11.3 g/dL — ABNORMAL LOW (ref 12.0–15.0)
MCH: 27.1 pg (ref 26.0–34.0)
MCHC: 32.3 g/dL (ref 30.0–36.0)
MCV: 83.9 fL (ref 80.0–100.0)
Platelets: 314 10*3/uL (ref 150–400)
RBC: 4.17 MIL/uL (ref 3.87–5.11)
RDW: 13.7 % (ref 11.5–15.5)
WBC: 7.3 10*3/uL (ref 4.0–10.5)
nRBC: 0 % (ref 0.0–0.2)

## 2022-02-17 LAB — HEPATITIS PANEL, ACUTE
HCV Ab: NONREACTIVE
Hep A IgM: NONREACTIVE
Hep B C IgM: NONREACTIVE
Hepatitis B Surface Ag: NONREACTIVE

## 2022-02-17 LAB — MONONUCLEOSIS SCREEN: Mono Screen: NEGATIVE

## 2022-02-17 NOTE — Discharge Summary (Signed)
RN reviewed discharge instructions with the patient. Gave patient opportunity for questions. All questions answered at this time. Pt verbalized understanding. Pt discharged home.

## 2022-02-17 NOTE — Discharge Summary (Signed)
OB Discharge Summary     Patient Name: Jessica Keith DOB: 12-28-2000 MRN: [id number]  Date of admission: 02/16/2022   Date of discharge: 02/17/2022  Admitting diagnosis: Elevated LFTs [R79.89] Abnormal LFTs [R79.89] Right upper quadrant abdominal pain [R10.11] Elevated liver enzymes [R74.8] Intrauterine pregnancy: Unknown     Secondary diagnosis:  Principal Problem:   Elevated LFTs Active Problems:   Obesity (BMI 30-39.9)   Abdominal pain   Elevated liver enzymes  Additional problems: none     Discharge diagnosis:  elevated liver function tests at 4 weeks post delivery                                                                                                 Post partum procedures: magnesium sulphate infusion       Hospital course:  Patient admitted from the ED with elevated LFTs, and acute right upper quadrant pain. Suspected late onset of pre eclampsia versus HELLP with unusual presentation. The patient has been treated with 12 hours of magnesium sulphate infusion. No other abnormal post partum symptoms noted. Gallstones dx ruled out via MRI and ultrasound. Post 12 hours of magnesium, the patient is feeling much imporved, and her LFTs continue on a downward trend. She is discharged home with plans for follow up in one week at [facility] with Dr. Rubie Maid.  Physical exam  Vitals:   02/17/22 0624 02/17/22 0730 02/17/22 0733 02/17/22 0830  BP: (!) 105/53  (!) 100/56 112/82  Pulse: 78  89 82  Resp: '16  16 16  '$ Temp:   98.3 F (36.8 C)   TempSrc:   Oral   SpO2:  100% 100% 100%  Weight:   92.1 kg   Height:   '5\' 3"'$  (1.6 m)    General: alert, cooperative, and no distress Lochia: light Abdomen- no tenderness, + bowel sounds Uterine Fundus: firm Incision: well healed DVT Evaluation: No evidence of DVT seen on physical exam. Negative Homan's sign. Labs: Lab Results  Component Value Date   WBC 7.3 02/17/2022   HGB 11.3 (L) 02/17/2022   HCT 35.0 (L)  02/17/2022   MCV 83.9 02/17/2022   PLT 314 02/17/2022      Latest Ref Rng & Units 02/17/2022    1:52 PM  CMP  Total Protein 6.5 - 8.1 g/dL 6.8   Total Bilirubin 0.3 - 1.2 mg/dL 0.6   Alkaline Phos 38 - 126 U/L 188   AST 15 - 41 U/L 98   ALT 0 - 44 U/L 200     Discharge instruction: per After Visit Summary and "Baby and Me Booklet".  After visit meds:  Allergies as of 02/17/2022   No Known Allergies      Medication List     STOP taking these medications    ondansetron 4 MG disintegrating tablet Commonly known as: ZOFRAN-ODT   pantoprazole 40 MG tablet Commonly known as: Protonix       TAKE these medications    Fusion Plus Caps Take 1 tablet by mouth daily.   ibuprofen 600 MG tablet Commonly known as: ADVIL  Take 1 tablet (600 mg total) by mouth every 6 (six) hours. What changed:  when to take this reasons to take this   PRENATAL PO Take 1 tablet by mouth daily.        Diet: routine diet  Activity: Advance as tolerated. Pelvic rest for 6 weeks.   Outpatient follow up:1 week Follow up Appt: Future Appointments  Date Time Provider [place]  03/04/2022  2:00 PM Philip Aspen, CNM [facility] None   Follow up Visit:No follow-ups on file.  Postpartum contraception: Not Discussed  02/17/2022 Imagene Riches, CNM

## 2022-02-18 DIAGNOSIS — Z419 Encounter for procedure for purposes other than remedying health state, unspecified: Secondary | ICD-10-CM | POA: Diagnosis not present

## 2022-03-04 ENCOUNTER — Encounter: Payer: Self-pay | Admitting: Certified Nurse Midwife

## 2022-03-04 ENCOUNTER — Ambulatory Visit (INDEPENDENT_AMBULATORY_CARE_PROVIDER_SITE_OTHER): Payer: Medicaid Other | Admitting: Certified Nurse Midwife

## 2022-03-04 DIAGNOSIS — R7989 Other specified abnormal findings of blood chemistry: Secondary | ICD-10-CM

## 2022-03-04 NOTE — Progress Notes (Signed)
Subjective:    Jessica Keith is a 21 y.o. G79P1001 female who presents for a postpartum visit. She is 6 weeks postpartum following a spontaneous vaginal delivery at 40.0  gestational weeks. Anesthesia: epidural. I have fully reviewed the prenatal and intrapartum course. Postpartum course has been normal . Baby's course has been normal. Baby is feeding by  breast and bottle . Bleeding no bleeding. Bowel function is normal. Bladder function is normal. Patient is not sexually active.. Contraception method is Nexplanon. Postpartum depression screening: positive. Score 9.  Last pap has not had.Due 05/2022.   The following portions of the patient's history were reviewed and updated as appropriate: allergies, current medications, past medical history, past surgical history and problem list.  Review of Systems Pertinent items are noted in HPI.   Vitals:   03/04/22 1402  BP: 108/72  Pulse: 91  Weight: 203 lb 14.4 oz (92.5 kg)  Height: '5\' 3"'$  (1.6 m)   Patient's last menstrual period was 02/13/2022 (exact date).  Objective:   General:  alert, cooperative and no distress   Breasts:  deferred, no complaints  Lungs: clear to auscultation bilaterally  Heart:  regular rate and rhythm  Abdomen: soft, nontender   Vulva: normal  Vagina: normal vagina  Cervix:  closed  Corpus: Well-involuted  Adnexa:  Non-palpable  Rectal Exam: no hemorrhoids        Assessment:   Postpartum exam 6 wks s/p SVD Breast and bottle feeding Depression screening Contraception counseling  Elevated liver enzymes Plan:  : Nexplanon, pt was readmitted postpartum for elevated LFTs, was given course of Magnesium. Repeat CMP today . Will follow up with results.  Follow up in: 4 months for annual exam  or earlier if needed  Philip Aspen, CNM

## 2022-03-04 NOTE — Patient Instructions (Signed)

## 2022-03-05 ENCOUNTER — Encounter: Payer: Self-pay | Admitting: Certified Nurse Midwife

## 2022-03-05 LAB — COMPREHENSIVE METABOLIC PANEL
ALT: 16 IU/L (ref 0–32)
AST: 16 IU/L (ref 0–40)
Albumin/Globulin Ratio: 1.7 (ref 1.2–2.2)
Albumin: 4.7 g/dL (ref 4.0–5.0)
Alkaline Phosphatase: 137 IU/L — ABNORMAL HIGH (ref 42–106)
BUN/Creatinine Ratio: 15 (ref 9–23)
BUN: 13 mg/dL (ref 6–20)
Bilirubin Total: 0.3 mg/dL (ref 0.0–1.2)
CO2: 19 mmol/L — ABNORMAL LOW (ref 20–29)
Calcium: 9.5 mg/dL (ref 8.7–10.2)
Chloride: 104 mmol/L (ref 96–106)
Creatinine, Ser: 0.84 mg/dL (ref 0.57–1.00)
Globulin, Total: 2.8 g/dL (ref 1.5–4.5)
Glucose: 77 mg/dL (ref 70–99)
Potassium: 4.5 mmol/L (ref 3.5–5.2)
Sodium: 139 mmol/L (ref 134–144)
Total Protein: 7.5 g/dL (ref 6.0–8.5)
eGFR: 102 mL/min/{1.73_m2} (ref >59)

## 2022-03-21 DIAGNOSIS — Z419 Encounter for procedure for purposes other than remedying health state, unspecified: Secondary | ICD-10-CM | POA: Diagnosis not present

## 2022-04-20 DIAGNOSIS — Z419 Encounter for procedure for purposes other than remedying health state, unspecified: Secondary | ICD-10-CM | POA: Diagnosis not present

## 2022-05-21 DIAGNOSIS — Z419 Encounter for procedure for purposes other than remedying health state, unspecified: Secondary | ICD-10-CM | POA: Diagnosis not present

## 2022-06-20 DIAGNOSIS — Z419 Encounter for procedure for purposes other than remedying health state, unspecified: Secondary | ICD-10-CM | POA: Diagnosis not present

## 2022-07-21 DIAGNOSIS — Z419 Encounter for procedure for purposes other than remedying health state, unspecified: Secondary | ICD-10-CM | POA: Diagnosis not present

## 2022-07-24 ENCOUNTER — Ambulatory Visit (INDEPENDENT_AMBULATORY_CARE_PROVIDER_SITE_OTHER): Payer: Medicaid Other | Admitting: Certified Nurse Midwife

## 2022-07-24 ENCOUNTER — Encounter: Payer: Self-pay | Admitting: Certified Nurse Midwife

## 2022-07-24 VITALS — BP 112/75 | HR 76 | Resp 15 | Wt 217.9 lb

## 2022-07-24 DIAGNOSIS — K429 Umbilical hernia without obstruction or gangrene: Secondary | ICD-10-CM | POA: Diagnosis not present

## 2022-07-24 NOTE — Progress Notes (Signed)
OB/GYN CONFERENCE NOTE:  Subjective:       Jessica Keith is a 22 y.o. G52P1001 female who presents for a conference appointment. Current complaints include: umbilical hernia, pt was diagnosised about a year ago with her pregnancy. She has since delivered and continues to have discomfort that is worse when lifting objects. .     Gynecologic History Patient's last menstrual period was 02/13/2022 (exact date). Contraception: Nexplanon  Obstetric History OB History  Gravida Para Term Preterm AB Living  '2 1 1 '$ 0 0 1  SAB IAB Ectopic Multiple Live Births  0 0 0 0 1    # Outcome Date GA Lbr Len/2nd Weight Sex Delivery Anes PTL Lv  2 Gravida           1 Term 01/19/22 [redacted]w[redacted]d/ 00:27 6 lb 11.2 oz (3.04 kg) F Vag-Spont EPI  LIV    Past Medical History:  Diagnosis Date   Labor and delivery, indication for care 01/17/2022   Medical history non-contributory    Umbilical hernia     Past Surgical History:  Procedure Laterality Date   ADENOIDECTOMY      Current Outpatient Medications on File Prior to Visit  Medication Sig Dispense Refill   ibuprofen (ADVIL) 600 MG tablet Take 1 tablet (600 mg total) by mouth every 6 (six) hours. (Patient not taking: Reported on 03/04/2022) 30 tablet 0   Iron-FA-B Cmp-C-Biot-Probiotic (FUSION PLUS) CAPS Take 1 tablet by mouth daily. (Patient not taking: Reported on 07/24/2022) 60 capsule 1   Prenatal Vit-Fe Fumarate-FA (PRENATAL PO) Take 1 tablet by mouth daily. (Patient not taking: Reported on 07/24/2022)     No current facility-administered medications on file prior to visit.    No Known Allergies  Social History   Socioeconomic History   Marital status: Single    Spouse name: Not on file   Number of children: Not on file   Years of education: Not on file   Highest education level: Not on file  Occupational History   Occupation: Delivery  Tobacco Use   Smoking status: Never   Smokeless tobacco: Never  Vaping Use   Vaping Use: Former  Substance  and Sexual Activity   Alcohol use: No   Drug use: No   Sexual activity: Yes    Partners: Male    Birth control/protection: Implant  Other Topics Concern   Not on file  Social History Narrative   Not on file   Social Determinants of Health   Financial Resource Strain: Not on file  Food Insecurity: Not on file  Transportation Needs: Not on file  Physical Activity: Not on file  Stress: Not on file  Social Connections: Not on file  Intimate Partner Violence: Not on file    Family History  Problem Relation Age of Onset   Hypertension Mother    Hypertension Father    Cancer Maternal Grandfather        prostate    The following portions of the patient's history were reviewed and updated as appropriate: allergies, current medications, past family history, past medical history, past social history, past surgical history and problem list.  Review of Systems Pertinent items are noted in HPI.   Objective:   BP 112/75   Pulse 76   Resp 15   Wt 217 lb 14.4 oz (98.8 kg)   LMP 02/13/2022 (Exact Date)   BMI 38.60 kg/m    Assessment/Plan:   Patient Active Problem List   Diagnosis Date Noted   Elevated  liver enzymes 02/17/2022   Elevated LFTs 02/16/2022   Abdominal pain 02/16/2022   Postpartum care following vaginal delivery 01/20/2022   Obesity (BMI 30-39.9) 09/17/2020   NEVUS, BENIGN 03/01/2010    Out pouching noted with lifting of her head on exam.     Time: 15  Return to Clinic: prn  Order placed for referral surgery   Philip Aspen, [facility]

## 2022-08-08 ENCOUNTER — Ambulatory Visit (INDEPENDENT_AMBULATORY_CARE_PROVIDER_SITE_OTHER): Payer: Medicaid Other | Admitting: Surgery

## 2022-08-08 ENCOUNTER — Encounter: Payer: Self-pay | Admitting: Surgery

## 2022-08-08 ENCOUNTER — Other Ambulatory Visit: Payer: Self-pay

## 2022-08-08 VITALS — BP 123/82 | HR 91 | Temp 98.5°F | Ht 63.0 in | Wt 214.0 lb

## 2022-08-08 DIAGNOSIS — K439 Ventral hernia without obstruction or gangrene: Secondary | ICD-10-CM

## 2022-08-08 DIAGNOSIS — K429 Umbilical hernia without obstruction or gangrene: Secondary | ICD-10-CM | POA: Diagnosis not present

## 2022-08-08 NOTE — Patient Instructions (Addendum)
Our surgery scheduler will call you within 24-48 hours to schedule your surgery. Please have the Blue surgery sheet available when speaking with her.    Ventral Hernia  A ventral hernia is a bulge of tissue from inside the abdomen that pushes through a weak area of the muscles that form the front wall of the abdomen. The tissues inside the abdomen are inside a sac (peritoneum). These tissues include the small intestine, large intestine, and the fatty tissue that covers the intestines (omentum). Sometimes, the bulge that forms a hernia contains intestines. Other hernias contain only fat. Ventral hernias do not go away without surgical treatment. There are several types of ventral hernias. You may have: A hernia at an incision site from previous abdominal surgery (incisional hernia). A hernia just above the belly button (epigastric hernia), or at the belly button (umbilical hernia). These types of hernias can develop from heavy lifting or straining. A hernia that comes and goes (reducible hernia). It may be visible only when you lift or strain. This type of hernia can be pushed back into the abdomen (reduced). A hernia that traps abdominal tissue inside the hernia (incarcerated hernia). This type of hernia does not reduce. A hernia that cuts off blood flow to the tissues inside the hernia (strangulated hernia). The tissues can start to die if this happens. This is a very painful bulge that cannot be reduced. A strangulated hernia is a medical emergency. What are the causes? This condition is caused by abdominal tissue putting pressure on an area of weakness in the abdominal muscles. What increases the risk? The following factors may make you more likely to develop this condition: Being age 60 or older. Being overweight or obese. Having had previous abdominal surgery, especially if there was an infection after surgery. Having had an injury to the abdominal wall. Frequently lifting or pushing heavy  objects. Having had several pregnancies. Having a buildup of fluid inside the abdomen (ascites). Straining to have a bowel movement or to urinate. Having frequent coughing episodes. What are the signs or symptoms? The only symptom of a ventral hernia may be a painless bulge in the abdomen. A reducible hernia may be visible only when you strain, cough, or lift. Other symptoms may include: Dull pain. A feeling of pressure. Signs and symptoms of a strangulated hernia may include: Increasing pain. Nausea and vomiting. Pain when pressing on the hernia. The skin over the hernia turning red or purple. Constipation. Blood in the stool (feces). How is this diagnosed? This condition may be diagnosed based on: Your symptoms. Your medical history. A physical exam. You may be asked to cough or strain while standing. These actions increase the pressure inside your abdomen and force the hernia through the opening in your muscles. Your health care provider may try to reduce the hernia by gently pushing the hernia back in. Imaging studies, such as an ultrasound or CT scan. How is this treated? This condition is treated with surgery. If you have a strangulated hernia, surgery is done as soon as possible. If your hernia is small and not incarcerated, you may be asked to lose some weight before surgery. Follow these instructions at home: Follow instructions from your health care provider about eating or drinking restrictions. If you are overweight, your health care provider may recommend that you increase your activity level and eat a healthier diet. Do not lift anything that is heavier than 10 lb (4.5 kg), or the limit that you are told, until your   health care provider says that it is safe. Return to your normal activities as told by your health care provider. Ask your health care provider what activities are safe for you. You may need to avoid activities that increase pressure on your hernia. Take  over-the-counter and prescription medicines only as told by your health care provider. Keep all follow-up visits. This is important. Contact a health care provider if: Your hernia gets larger. Your hernia becomes painful. Get help right away if: Your hernia becomes increasingly painful. You have pain along with any of the following: Changes in skin color in the area of the hernia. Nausea. Vomiting. Fever. These symptoms may represent a serious problem that is an emergency. Do not wait to see if the symptoms will go away. Get medical help right away. Call your local emergency services (911 in the [place] Do not drive yourself to the hospital. Summary A ventral hernia is a bulge of tissue from inside the abdomen that pushes through a weak area of the muscles that form the front wall of the abdomen. This condition is treated with surgery, which may be urgent depending on your hernia. Do not lift anything that is heavier than 10 lb (4.5 kg), and follow activity instructions from your health care provider. This information is not intended to replace advice given to you by your health care provider. Make sure you discuss any questions you have with your health care provider. Document Revised: 02/24/2020 Document Reviewed: 02/24/2020 Elsevier Patient Education  2023 [employer] Inc[employer]  

## 2022-08-08 NOTE — Progress Notes (Signed)
08/08/2022  Reason for Visit: Umbilical hernia  Requesting Provider: Annie Thompson, CNM  History of Present Illness: Jessica Keith is a 22 y.o. female presenting for evaluation of an umbilical hernia. The patient developed an umbilical hernia during her last pregnancy.  She had a vaginal delivery on 01/19/22.  The patient reports that after her pregnancy, she is still having issues with abdominal discomfort at the umbilical area, particularly if she's doing any strenuous activity.  The discomfort has not become severe, and improves if she's relaxing.  She had a CT scan on 02/16/22 which confirmed the umbilical hernia and on view of her images, it appears that she has at least two defects, one at the base of the umbilicus, and one a few centimeters superior to it, which contains fat and is the location of her bigger discomfort.  Denies any nausea, vomiting, constipation, or diarrhea.  Past Medical History: Past Medical History:  Diagnosis Date   Labor and delivery, indication for care 01/17/2022   Medical history non-contributory    Umbilical hernia      Past Surgical History: Past Surgical History:  Procedure Laterality Date   ADENOIDECTOMY      Home Medications: Prior to Admission medications   Medication Sig Start Date End Date Taking? Authorizing Provider  ibuprofen (ADVIL) 600 MG tablet Take 1 tablet (600 mg total) by mouth every 6 (six) hours. 01/20/22  Yes Fryer, Margaret M, CNM  Iron-FA-B Cmp-C-Biot-Probiotic (FUSION PLUS) CAPS Take 1 tablet by mouth daily. 10/29/21  Yes Swanson, Melissa M, CNM  Prenatal Vit-Fe Fumarate-FA (PRENATAL PO) Take 1 tablet by mouth daily.   Yes [provider]    Allergies: No Known Allergies  Social History:  reports that she has never smoked. She has never used smokeless tobacco. She reports that she does not drink alcohol and does not use drugs.   Family History: Family History  Problem Relation Age of Onset   Hypertension Mother     Hypertension Father    Cancer Maternal Grandfather        prostate    Review of Systems: Review of Systems  Constitutional:  Negative for chills and fever.  HENT:  Negative for hearing loss.   Respiratory:  Negative for shortness of breath.   Cardiovascular:  Negative for chest pain.  Gastrointestinal:  Positive for abdominal pain. Negative for constipation, diarrhea, nausea and vomiting.  Genitourinary:  Negative for dysuria.  Musculoskeletal:  Negative for myalgias.  Skin:  Negative for rash.  Neurological:  Negative for dizziness.  Psychiatric/Behavioral:  Negative for depression.     Physical Exam BP 123/82   Pulse 91   Temp 98.5 F (36.9 C) (Oral)   Ht 5' 3" (1.6 m)   Wt 214 lb (97.1 kg)   LMP 02/13/2022 (Exact Date)   SpO2 97%   BMI 37.91 kg/m  CONSTITUTIONAL: No acute distress, well-nourished HEENT:  Normocephalic, atraumatic, extraocular motion intact. NECK: Trachea is midline, and there is no jugular venous distension.  RESPIRATORY:  Lungs are clear, and breath sounds are equal bilaterally. Normal respiratory effort without pathologic use of accessory muscles. CARDIOVASCULAR: Heart is regular without murmurs, gallops, or rubs. GI: The abdomen is soft, nondistended, with some tenderness to palpation in the periumbilical area.  The patient does have at least 2 hernia defects.  The patient has a very small hernia defect at the base of the umbilicus itself, and then a larger although still small defect of about 1 cm in size located about 4   cm superior to the umbilicus.  This was the more palpable area with bulging and is an area where the patient reports that her pain location.  MUSCULOSKELETAL:  Normal muscle strength and tone in all four extremities.  No peripheral edema or cyanosis. SKIN: Skin turgor is normal. There are no pathologic skin lesions.  NEUROLOGIC:  Motor and sensation is grossly normal.  Cranial nerves are grossly intact. PSYCH:  Alert and oriented to  person, place and time. Affect is normal.  Laboratory Analysis: Labs from 03/04/2022: Sodium 139, potassium 4.5, chloride 104, CO2 19, BUN 13, creatinine 0.84.  Total bilirubin 0.3, AST 16, ALT 16, alkaline phosphatase 137, albumin 4.7.  Imaging: CT chest/abdomen/pelvis on 02/16/2022: IMPRESSION: CTA of the chest: No evidence of pulmonary emboli.   No acute abnormality seen.   CT of the abdomen and pelvis: Small supraumbilical fat containing hernia.   No other focal abnormality is seen.  Assessment and Plan: This is a 22 y.o. female with umbilical and supraumbilical hernias.  --Discussed with the patient the findings on her CT scan and her exam today.  She has a main area of discomfort which is supraumbilical, and there's a small hernia at the base of the umbilicus as well.  On CT scan, she could possibly have much smaller hernia defects between these two, but is a bit unclear.  Discussed with her that unfortunately there is no medical/exercise treatment that can repair the hernia, and she would need surgery to repair it.  She's in agreement. --Discussed with her then the plan for a robotic assisted ventral hernia repair, and reviewed the surgery at length with her including the planned incisions, the risks of bleeding, infection, injury to surrounding structures, the use of mesh, that this would be an outpatient surgery, post-operative activity restrictions, pain control, and she's willing to proceed. --Will schedule the patient for surgery on 09/04/22.  All of her questions have been answered.  I spent 55 minutes dedicated to the care of this patient on the date of this encounter to include pre-visit review of records, face-to-face time with the patient discussing diagnosis and management, and any post-visit coordination of care.   Teague Goynes Luis Philo Kurtz, MD [facility]    

## 2022-08-08 NOTE — H&P (View-Only) (Signed)
08/08/2022  Reason for Visit: Umbilical hernia  Requesting Provider: Philip Aspen, CNM  History of Present Illness: Jessica Keith is a 22 y.o. female presenting for evaluation of an umbilical hernia. The patient developed an umbilical hernia during her last pregnancy.  She had a vaginal delivery on 01/19/22.  The patient reports that after her pregnancy, she is still having issues with abdominal discomfort at the umbilical area, particularly if she's doing any strenuous activity.  The discomfort has not become severe, and improves if she's relaxing.  She had a CT scan on 02/16/22 which confirmed the umbilical hernia and on view of her images, it appears that she has at least two defects, one at the base of the umbilicus, and one a few centimeters superior to it, which contains fat and is the location of her bigger discomfort.  Denies any nausea, vomiting, constipation, or diarrhea.  Past Medical History: Past Medical History:  Diagnosis Date   Labor and delivery, indication for care 01/17/2022   Medical history non-contributory    Umbilical hernia      Past Surgical History: Past Surgical History:  Procedure Laterality Date   ADENOIDECTOMY      Home Medications: Prior to Admission medications   Medication Sig Start Date End Date Taking? Authorizing Provider  ibuprofen (ADVIL) 600 MG tablet Take 1 tablet (600 mg total) by mouth every 6 (six) hours. 01/20/22  Yes Imagene Riches, CNM  Iron-FA-B Cmp-C-Biot-Probiotic (FUSION PLUS) CAPS Take 1 tablet by mouth daily. 10/29/21  Yes Swanson, Ian Bushman, CNM  Prenatal Vit-Fe Fumarate-FA (PRENATAL PO) Take 1 tablet by mouth daily.   Yes [provider]    Allergies: No Known Allergies  Social History:  reports that she has never smoked. She has never used smokeless tobacco. She reports that she does not drink alcohol and does not use drugs.   Family History: Family History  Problem Relation Age of Onset   Hypertension Mother     Hypertension Father    Cancer Maternal Grandfather        prostate    Review of Systems: Review of Systems  Constitutional:  Negative for chills and fever.  HENT:  Negative for hearing loss.   Respiratory:  Negative for shortness of breath.   Cardiovascular:  Negative for chest pain.  Gastrointestinal:  Positive for abdominal pain. Negative for constipation, diarrhea, nausea and vomiting.  Genitourinary:  Negative for dysuria.  Musculoskeletal:  Negative for myalgias.  Skin:  Negative for rash.  Neurological:  Negative for dizziness.  Psychiatric/Behavioral:  Negative for depression.     Physical Exam BP 123/82   Pulse 91   Temp 98.5 F (36.9 C) (Oral)   Ht 5' 3"$  (1.6 m)   Wt 214 lb (97.1 kg)   LMP 02/13/2022 (Exact Date)   SpO2 97%   BMI 37.91 kg/m  CONSTITUTIONAL: No acute distress, well-nourished HEENT:  Normocephalic, atraumatic, extraocular motion intact. NECK: Trachea is midline, and there is no jugular venous distension.  RESPIRATORY:  Lungs are clear, and breath sounds are equal bilaterally. Normal respiratory effort without pathologic use of accessory muscles. CARDIOVASCULAR: Heart is regular without murmurs, gallops, or rubs. GI: The abdomen is soft, nondistended, with some tenderness to palpation in the periumbilical area.  The patient does have at least 2 hernia defects.  The patient has a very small hernia defect at the base of the umbilicus itself, and then a larger although still small defect of about 1 cm in size located about 4  cm superior to the umbilicus.  This was the more palpable area with bulging and is an area where the patient reports that her pain location.  MUSCULOSKELETAL:  Normal muscle strength and tone in all four extremities.  No peripheral edema or cyanosis. SKIN: Skin turgor is normal. There are no pathologic skin lesions.  NEUROLOGIC:  Motor and sensation is grossly normal.  Cranial nerves are grossly intact. PSYCH:  Alert and oriented to  person, place and time. Affect is normal.  Laboratory Analysis: Labs from 03/04/2022: Sodium 139, potassium 4.5, chloride 104, CO2 19, BUN 13, creatinine 0.84.  Total bilirubin 0.3, AST 16, ALT 16, alkaline phosphatase 137, albumin 4.7.  Imaging: CT chest/abdomen/pelvis on 02/16/2022: IMPRESSION: CTA of the chest: No evidence of pulmonary emboli.   No acute abnormality seen.   CT of the abdomen and pelvis: Small supraumbilical fat containing hernia.   No other focal abnormality is seen.  Assessment and Plan: This is a 22 y.o. female with umbilical and supraumbilical hernias.  --Discussed with the patient the findings on her CT scan and her exam today.  She has a main area of discomfort which is supraumbilical, and there's a small hernia at the base of the umbilicus as well.  On CT scan, she could possibly have much smaller hernia defects between these two, but is a bit unclear.  Discussed with her that unfortunately there is no medical/exercise treatment that can repair the hernia, and she would need surgery to repair it.  She's in agreement. --Discussed with her then the plan for a robotic assisted ventral hernia repair, and reviewed the surgery at length with her including the planned incisions, the risks of bleeding, infection, injury to surrounding structures, the use of mesh, that this would be an outpatient surgery, post-operative activity restrictions, pain control, and she's willing to proceed. --Will schedule the patient for surgery on 09/04/22.  All of her questions have been answered.  I spent 55 minutes dedicated to the care of this patient on the date of this encounter to include pre-visit review of records, face-to-face time with the patient discussing diagnosis and management, and any post-visit coordination of care.   Melvyn Neth, [facility]

## 2022-08-11 ENCOUNTER — Telehealth: Payer: Self-pay | Admitting: Surgery

## 2022-08-11 ENCOUNTER — Telehealth: Payer: Self-pay

## 2022-08-11 NOTE — Telephone Encounter (Signed)
Left message for patient to call, please inform her of the following scheduled surgery with Dr. Hampton Abbot.    Pre-Admission date/time, and Surgery date at [facility].  Surgery Date: 09/04/22 Preadmission Testing Date: 08/26/22 (phone 8a-1p)  Also patient will need to call at [phone], between 1-3:00pm the day before surgery, to find out what time to arrive for surgery.

## 2022-08-11 NOTE — Telephone Encounter (Signed)
Patient notified of all surgery information.

## 2022-08-12 NOTE — Telephone Encounter (Signed)
Called patient again, she is now informed of all dates regarding her surgery and verbalized understanding.

## 2022-08-21 DIAGNOSIS — Z419 Encounter for procedure for purposes other than remedying health state, unspecified: Secondary | ICD-10-CM | POA: Diagnosis not present

## 2022-08-26 ENCOUNTER — Encounter
Admission: RE | Admit: 2022-08-26 | Discharge: 2022-08-26 | Disposition: A | Discharge status: Home / Self Care | Payer: Medicaid Other | Source: Ambulatory Visit | Attending: Surgery | Admitting: Surgery

## 2022-08-26 VITALS — Ht 63.0 in | Wt 214.0 lb

## 2022-08-26 DIAGNOSIS — Z01812 Encounter for preprocedural laboratory examination: Secondary | ICD-10-CM

## 2022-08-26 NOTE — Patient Instructions (Addendum)
Your procedure is scheduled on: Thursday, February 15 Report to the Registration Desk on the 1st floor of the [facility]. To find out your arrival time, please call [phone] between 1PM - 3PM on: Wednesday, February 14 If your arrival time is 6:00 am, do not arrive before that time as the [facility] entrance doors do not open until 6:00 am.  REMEMBER: Instructions that are not followed completely may result in serious medical risk, up to and including death; or upon the discretion of your surgeon and anesthesiologist your surgery may need to be rescheduled.  Do not eat food after midnight the night before surgery.  No gum chewing or hard candies.  You may however, drink CLEAR liquids up to 2 hours before you are scheduled to arrive for your surgery. Do not drink anything within 2 hours of your scheduled arrival time.  Clear liquids include: - water  - apple juice without pulp - gatorade (not RED colors) - black coffee or tea (Do NOT add milk or creamers to the coffee or tea) Do NOT drink anything that is not on this list.  One week prior to surgery: starting February 8 Stop Anti-inflammatories (NSAIDS) such as Advil, Aleve, Ibuprofen, Motrin, Naproxen, Naprosyn and Aspirin based products such as Excedrin, Goody's Powder, BC Powder. Stop ANY OVER THE COUNTER supplements until after surgery. You may however, continue to take Tylenol if needed for pain up until the day of surgery.  DO NOT TAKE ANY MEDICATIONS THE MORNING OF SURGERY  No Alcohol for 24 hours before or after surgery.  No Smoking including e-cigarettes for 24 hours before surgery.  No chewable tobacco products for at least 6 hours before surgery.  No nicotine patches on the day of surgery.  Do not use any "recreational" drugs for at least a week (preferably 2 weeks) before your surgery.  Please be advised that the combination of cocaine and anesthesia may have negative outcomes, up to and including death. If you  test positive for cocaine, your surgery will be cancelled.  On the morning of surgery brush your teeth with toothpaste and water, you may rinse your mouth with mouthwash if you wish. Do not swallow any toothpaste or mouthwash.  Use CHG Soap as directed on instruction sheet.  Do not wear jewelry, make-up, hairpins, clips or nail polish.  Do not wear lotions, powders, or perfumes.   Do not shave body hair from the neck down 48 hours before surgery.  Contact lenses, hearing aids and dentures may not be worn into surgery.  Do not bring valuables to the hospital. [facility] is not responsible for any missing/lost belongings or valuables.   Notify your doctor if there is any change in your medical condition (cold, fever, infection).  Wear comfortable clothing (specific to your surgery type) to the hospital.  After surgery, you can help prevent lung complications by doing breathing exercises.  Take deep breaths and cough every 1-2 hours. Your doctor may order a device called an Incentive Spirometer to help you take deep breaths.  If you are being discharged the day of surgery, you will not be allowed to drive home. You will need a responsible individual to drive you home and stay with you for 24 hours after surgery.   If you are taking public transportation, you will need to have a responsible individual with you.  Please call the [place] [facility] at [phone] if you have any questions about these instructions.  Surgery Visitation Policy:  Patients undergoing a surgery or procedure may have two family members or support persons with them as long as the person is not COVID-19 positive or experiencing its symptoms.      Preparing for Surgery with CHLORHEXIDINE GLUCONATE (CHG) Soap  Chlorhexidine Gluconate (CHG) Soap  o An antiseptic cleaner that kills germs and bonds with the skin to continue killing germs even after washing  o Used for showering the night before  surgery and morning of surgery  Before surgery, you can play an important role by reducing the number of germs on your skin.  CHG (Chlorhexidine gluconate) soap is an antiseptic cleanser which kills germs and bonds with the skin to continue killing germs even after washing.  Please do not use if you have an allergy to CHG or antibacterial soaps. If your skin becomes reddened/irritated stop using the CHG.  1. Shower the NIGHT BEFORE SURGERY and the MORNING OF SURGERY with CHG soap.  2. If you choose to wash your hair, wash your hair first as usual with your normal shampoo.  3. After shampooing, rinse your hair and body thoroughly to remove the shampoo.  4. Use CHG as you would any other liquid soap. You can apply CHG directly to the skin and wash gently with a scrungie or a clean washcloth.  5. Apply the CHG soap to your body only from the neck down. Do not use on open wounds or open sores. Avoid contact with your eyes, ears, mouth, and genitals (private parts). Wash face and genitals (private parts) with your normal soap.  6. Wash thoroughly, paying special attention to the area where your surgery will be performed.  7. Thoroughly rinse your body with warm water.  8. Do not shower/wash with your normal soap after using and rinsing off the CHG soap.  9. Pat yourself dry with a clean towel.  10. Wear clean pajamas to bed the night before surgery.  12. Place clean sheets on your bed the night of your first shower and do not sleep with pets.  13. Shower again with the CHG soap on the day of surgery prior to arriving at the hospital.  14. Do not apply any deodorants/lotions/powders.  15. Please wear clean clothes to the hospital.

## 2022-09-03 MED ORDER — CEFAZOLIN SODIUM-DEXTROSE 2-4 GM/100ML-% IV SOLN
2.0000 g | INTRAVENOUS | Status: AC
  Administered 2022-09-04: 2 g via INTRAVENOUS

## 2022-09-03 MED ORDER — ACETAMINOPHEN 500 MG PO TABS: 1000.0000 mg | ORAL_TABLET | ORAL | Status: AC

## 2022-09-03 MED ORDER — CHLORHEXIDINE GLUCONATE CLOTH 2 % EX PADS: 6.0000 | MEDICATED_PAD | Freq: Once | CUTANEOUS | Status: DC

## 2022-09-03 MED ORDER — CHLORHEXIDINE GLUCONATE 0.12 % MT SOLN: 15.0000 mL | Freq: Once | OROMUCOSAL | Status: AC

## 2022-09-03 MED ORDER — LACTATED RINGERS IV SOLN: INTRAVENOUS | Status: DC

## 2022-09-03 MED ORDER — BUPIVACAINE LIPOSOME 1.3 % IJ SUSP: 20.0000 mL | Freq: Once | INTRAMUSCULAR | Status: DC

## 2022-09-03 MED ORDER — FAMOTIDINE 20 MG PO TABS: 20.0000 mg | ORAL_TABLET | Freq: Once | ORAL | Status: AC

## 2022-09-03 MED ORDER — ORAL CARE MOUTH RINSE: 15.0000 mL | Freq: Once | OROMUCOSAL | Status: AC

## 2022-09-03 MED ORDER — GABAPENTIN 300 MG PO CAPS: 300.0000 mg | ORAL_CAPSULE | ORAL | Status: AC

## 2022-09-03 MED ORDER — CHLORHEXIDINE GLUCONATE CLOTH 2 % EX PADS
6.0000 | MEDICATED_PAD | Freq: Once | CUTANEOUS | Status: AC
  Administered 2022-09-04: 6 via TOPICAL

## 2022-09-04 ENCOUNTER — Other Ambulatory Visit: Payer: Self-pay

## 2022-09-04 ENCOUNTER — Ambulatory Visit
Admission: RE | Admit: 2022-09-04 | Discharge: 2022-09-04 | Disposition: A | Discharge status: Home / Self Care | Payer: Medicaid Other | Attending: Surgery | Admitting: Surgery

## 2022-09-04 ENCOUNTER — Ambulatory Visit: Payer: Medicaid Other | Admitting: Anesthesiology

## 2022-09-04 ENCOUNTER — Encounter
Admission: RE | Disposition: A | Discharge status: Home / Self Care | Payer: Self-pay | Source: Home / Self Care | Attending: Surgery

## 2022-09-04 ENCOUNTER — Encounter: Payer: Self-pay | Admitting: Surgery

## 2022-09-04 DIAGNOSIS — K429 Umbilical hernia without obstruction or gangrene: Secondary | ICD-10-CM | POA: Diagnosis not present

## 2022-09-04 DIAGNOSIS — K439 Ventral hernia without obstruction or gangrene: Secondary | ICD-10-CM | POA: Diagnosis not present

## 2022-09-04 DIAGNOSIS — E669 Obesity, unspecified: Secondary | ICD-10-CM | POA: Diagnosis not present

## 2022-09-04 DIAGNOSIS — Z6837 Body mass index (BMI) 37.0-37.9, adult: Secondary | ICD-10-CM | POA: Diagnosis not present

## 2022-09-04 DIAGNOSIS — Z01812 Encounter for preprocedural laboratory examination: Secondary | ICD-10-CM

## 2022-09-04 HISTORY — PX: XI ROBOTIC ASSISTED VENTRAL HERNIA: SHX6789

## 2022-09-04 HISTORY — PX: INSERTION OF MESH: SHX5868

## 2022-09-04 LAB — POCT PREGNANCY, URINE: Preg Test, Ur: NEGATIVE

## 2022-09-04 SURGERY — REPAIR, HERNIA, VENTRAL, ROBOT-ASSISTED
Anesthesia: General | Site: Abdomen

## 2022-09-04 MED ORDER — BUPIVACAINE LIPOSOME 1.3 % IJ SUSP
INTRAMUSCULAR | Status: AC
  Filled 2022-09-04: qty 20

## 2022-09-04 MED ORDER — EPINEPHRINE PF 1 MG/ML IJ SOLN
INTRAMUSCULAR | Status: AC
  Filled 2022-09-04: qty 1

## 2022-09-04 MED ORDER — DEXAMETHASONE SODIUM PHOSPHATE 10 MG/ML IJ SOLN
INTRAMUSCULAR | Status: DC | PRN
  Administered 2022-09-04: 10 mg via INTRAVENOUS

## 2022-09-04 MED ORDER — LIDOCAINE HCL (PF) 2 % IJ SOLN
INTRAMUSCULAR | Status: AC
  Filled 2022-09-04: qty 5

## 2022-09-04 MED ORDER — DEXAMETHASONE SODIUM PHOSPHATE 10 MG/ML IJ SOLN
INTRAMUSCULAR | Status: AC
  Filled 2022-09-04: qty 1

## 2022-09-04 MED ORDER — ACETAMINOPHEN 500 MG PO TABS
ORAL_TABLET | ORAL | Status: AC
  Administered 2022-09-04: 1000 mg via ORAL
  Filled 2022-09-04: qty 2

## 2022-09-04 MED ORDER — ONDANSETRON HCL 4 MG/2ML IJ SOLN
INTRAMUSCULAR | Status: AC
  Filled 2022-09-04: qty 2

## 2022-09-04 MED ORDER — FENTANYL CITRATE (PF) 100 MCG/2ML IJ SOLN
INTRAMUSCULAR | Status: DC | PRN
  Administered 2022-09-04 (×2): 50 ug via INTRAVENOUS

## 2022-09-04 MED ORDER — BUPIVACAINE HCL (PF) 0.5 % IJ SOLN
INTRAMUSCULAR | Status: AC
  Filled 2022-09-04: qty 30

## 2022-09-04 MED ORDER — ACETAMINOPHEN 500 MG PO TABS: 1000.0000 mg | ORAL_TABLET | Freq: Four times a day (QID) | ORAL | Status: DC | PRN

## 2022-09-04 MED ORDER — PROPOFOL 1000 MG/100ML IV EMUL
INTRAVENOUS | Status: AC
  Filled 2022-09-04: qty 100

## 2022-09-04 MED ORDER — PROPOFOL 10 MG/ML IV BOLUS
INTRAVENOUS | Status: AC
  Filled 2022-09-04: qty 20

## 2022-09-04 MED ORDER — 0.9 % SODIUM CHLORIDE (POUR BTL) OPTIME
TOPICAL | Status: DC | PRN
  Administered 2022-09-04: 200 mL

## 2022-09-04 MED ORDER — OXYCODONE HCL 5 MG PO TABS: 5.0000 mg | ORAL_TABLET | ORAL | 0 refills | Status: DC | PRN

## 2022-09-04 MED ORDER — KETOROLAC TROMETHAMINE 30 MG/ML IJ SOLN
INTRAMUSCULAR | Status: AC
  Filled 2022-09-04: qty 1

## 2022-09-04 MED ORDER — FENTANYL CITRATE (PF) 100 MCG/2ML IJ SOLN
INTRAMUSCULAR | Status: AC
  Filled 2022-09-04: qty 2

## 2022-09-04 MED ORDER — CHLORHEXIDINE GLUCONATE 0.12 % MT SOLN
OROMUCOSAL | Status: AC
  Administered 2022-09-04: 15 mL via OROMUCOSAL
  Filled 2022-09-04: qty 15

## 2022-09-04 MED ORDER — BUPIVACAINE-EPINEPHRINE (PF) 0.5% -1:200000 IJ SOLN
INTRAMUSCULAR | Status: DC | PRN
  Administered 2022-09-04: 50 mL

## 2022-09-04 MED ORDER — DEXMEDETOMIDINE HCL IN NACL 80 MCG/20ML IV SOLN
INTRAVENOUS | Status: AC
  Filled 2022-09-04: qty 20

## 2022-09-04 MED ORDER — OXYCODONE HCL 5 MG/5ML PO SOLN: 5.0000 mg | Freq: Once | ORAL | Status: AC | PRN

## 2022-09-04 MED ORDER — ONDANSETRON HCL 4 MG PO TABS: 4.0000 mg | ORAL_TABLET | Freq: Three times a day (TID) | ORAL | 0 refills | Status: DC | PRN

## 2022-09-04 MED ORDER — CEFAZOLIN SODIUM-DEXTROSE 2-4 GM/100ML-% IV SOLN
INTRAVENOUS | Status: AC
  Filled 2022-09-04: qty 100

## 2022-09-04 MED ORDER — GABAPENTIN 300 MG PO CAPS
ORAL_CAPSULE | ORAL | Status: AC
  Administered 2022-09-04: 300 mg via ORAL
  Filled 2022-09-04: qty 1

## 2022-09-04 MED ORDER — SUCCINYLCHOLINE CHLORIDE 200 MG/10ML IV SOSY
PREFILLED_SYRINGE | INTRAVENOUS | Status: DC | PRN
  Administered 2022-09-04: 120 mg via INTRAVENOUS

## 2022-09-04 MED ORDER — OXYCODONE HCL 5 MG PO TABS
ORAL_TABLET | ORAL | Status: AC
  Filled 2022-09-04: qty 1

## 2022-09-04 MED ORDER — IBUPROFEN 800 MG PO TABS: 800.0000 mg | ORAL_TABLET | Freq: Three times a day (TID) | ORAL | 1 refills | Status: DC | PRN

## 2022-09-04 MED ORDER — OXYCODONE HCL 5 MG PO TABS
5.0000 mg | ORAL_TABLET | Freq: Once | ORAL | Status: AC | PRN
  Administered 2022-09-04: 5 mg via ORAL

## 2022-09-04 MED ORDER — ONDANSETRON HCL 4 MG/2ML IJ SOLN
INTRAMUSCULAR | Status: DC | PRN
  Administered 2022-09-04: 4 mg via INTRAVENOUS

## 2022-09-04 MED ORDER — PROPOFOL 10 MG/ML IV BOLUS
INTRAVENOUS | Status: DC | PRN
  Administered 2022-09-04: 150 mg via INTRAVENOUS
  Administered 2022-09-04: 140 ug/kg/min via INTRAVENOUS

## 2022-09-04 MED ORDER — KETOROLAC TROMETHAMINE 30 MG/ML IJ SOLN
INTRAMUSCULAR | Status: DC | PRN
  Administered 2022-09-04: 30 mg via INTRAVENOUS

## 2022-09-04 MED ORDER — LIDOCAINE HCL (CARDIAC) PF 100 MG/5ML IV SOSY
PREFILLED_SYRINGE | INTRAVENOUS | Status: DC | PRN
  Administered 2022-09-04: 100 mg via INTRAVENOUS

## 2022-09-04 MED ORDER — FAMOTIDINE 20 MG PO TABS
ORAL_TABLET | ORAL | Status: AC
  Administered 2022-09-04: 20 mg via ORAL
  Filled 2022-09-04: qty 1

## 2022-09-04 MED ORDER — ROCURONIUM BROMIDE 10 MG/ML (PF) SYRINGE
PREFILLED_SYRINGE | INTRAVENOUS | Status: AC
  Filled 2022-09-04: qty 10

## 2022-09-04 MED ORDER — DEXMEDETOMIDINE HCL IN NACL 80 MCG/20ML IV SOLN
INTRAVENOUS | Status: DC | PRN
  Administered 2022-09-04: 8 ug via BUCCAL

## 2022-09-04 MED ORDER — FENTANYL CITRATE (PF) 100 MCG/2ML IJ SOLN
25.0000 ug | INTRAMUSCULAR | Status: DC | PRN
  Administered 2022-09-04 (×2): 25 ug via INTRAVENOUS

## 2022-09-04 MED ORDER — MIDAZOLAM HCL 2 MG/2ML IJ SOLN
INTRAMUSCULAR | Status: AC
  Filled 2022-09-04: qty 2

## 2022-09-04 MED ORDER — SUCCINYLCHOLINE CHLORIDE 200 MG/10ML IV SOSY
PREFILLED_SYRINGE | INTRAVENOUS | Status: AC
  Filled 2022-09-04: qty 10

## 2022-09-04 MED ORDER — SUGAMMADEX SODIUM 200 MG/2ML IV SOLN
INTRAVENOUS | Status: DC | PRN
  Administered 2022-09-04: 200 mg via INTRAVENOUS

## 2022-09-04 MED ORDER — ROCURONIUM BROMIDE 100 MG/10ML IV SOLN
INTRAVENOUS | Status: DC | PRN
  Administered 2022-09-04: 60 mg via INTRAVENOUS
  Administered 2022-09-04 (×2): 10 mg via INTRAVENOUS

## 2022-09-04 MED ORDER — MIDAZOLAM HCL 2 MG/2ML IJ SOLN
INTRAMUSCULAR | Status: DC | PRN
  Administered 2022-09-04: 2 mg via INTRAVENOUS

## 2022-09-04 MED ORDER — ONDANSETRON HCL 4 MG/2ML IJ SOLN: 4.0000 mg | Freq: Once | INTRAMUSCULAR | Status: DC | PRN

## 2022-09-04 MED ORDER — PHENYLEPHRINE HCL (PRESSORS) 10 MG/ML IV SOLN
INTRAVENOUS | Status: DC | PRN
  Administered 2022-09-04 (×4): 80 ug via INTRAVENOUS

## 2022-09-04 MED ORDER — PHENYLEPHRINE 80 MCG/ML (10ML) SYRINGE FOR IV PUSH (FOR BLOOD PRESSURE SUPPORT)
PREFILLED_SYRINGE | INTRAVENOUS | Status: AC
  Filled 2022-09-04: qty 10

## 2022-09-04 SURGICAL SUPPLY — 67 items
ADH SKN CLS APL DERMABOND .7 (GAUZE/BANDAGES/DRESSINGS) ×2
BLADE SURG SZ11 CARB STEEL (BLADE) ×2 IMPLANT
CANNULA CAP OBTURATR AIRSEAL 8 (CAP) IMPLANT
CANNULA REDUC XI 12-8 STAPL (CANNULA) ×2
CANNULA REDUCER 12-8 DVNC XI (CANNULA) ×2 IMPLANT
COVER TIP SHEARS 8 DVNC (MISCELLANEOUS) ×2 IMPLANT
COVER TIP SHEARS 8MM DA VINCI (MISCELLANEOUS) ×2
COVER WAND RF STERILE (DRAPES) ×2 IMPLANT
DERMABOND ADVANCED .7 DNX12 (GAUZE/BANDAGES/DRESSINGS) ×2 IMPLANT
DRAPE ARM DVNC X/XI (DISPOSABLE) ×6 IMPLANT
DRAPE COLUMN DVNC XI (DISPOSABLE) ×2 IMPLANT
DRAPE DA VINCI XI ARM (DISPOSABLE) ×6
DRAPE DA VINCI XI COLUMN (DISPOSABLE) ×2
ELECT CAUTERY BLADE TIP 2.5 (TIP) ×2
ELECT REM PT RETURN 9FT ADLT (ELECTROSURGICAL) ×2
ELECTRODE CAUTERY BLDE TIP 2.5 (TIP) ×2 IMPLANT
ELECTRODE REM PT RTRN 9FT ADLT (ELECTROSURGICAL) ×2 IMPLANT
GLOVE SURG SYN 7.0 (GLOVE) ×4 IMPLANT
GLOVE SURG SYN 7.0 PF PI (GLOVE) ×4 IMPLANT
GLOVE SURG SYN 7.5  E (GLOVE) ×4
GLOVE SURG SYN 7.5 E (GLOVE) ×4 IMPLANT
GLOVE SURG SYN 7.5 PF PI (GLOVE) ×4 IMPLANT
GOWN STRL REUS W/ TWL LRG LVL3 (GOWN DISPOSABLE) ×6 IMPLANT
GOWN STRL REUS W/TWL LRG LVL3 (GOWN DISPOSABLE) ×6
GRASPER SUT TROCAR 14GX15 (MISCELLANEOUS) ×2 IMPLANT
IRRIGATION STRYKERFLOW (MISCELLANEOUS) IMPLANT
IRRIGATOR STRYKERFLOW (MISCELLANEOUS)
IV NS 1000ML (IV SOLUTION)
IV NS 1000ML BAXH (IV SOLUTION) IMPLANT
KIT PINK PAD W/HEAD ARE REST (MISCELLANEOUS) ×2
KIT PINK PAD W/HEAD ARM REST (MISCELLANEOUS) ×2 IMPLANT
LABEL OR SOLS (LABEL) ×2 IMPLANT
MANIFOLD NEPTUNE II (INSTRUMENTS) ×2 IMPLANT
MESH VENT LT ST 15CM CRL ECHO2 (Mesh General) IMPLANT
MESH VENTRALIGHT ST 4.5 ECHO (Mesh General) IMPLANT
NDL INSUFFLATION 14GA 120MM (NEEDLE) ×2 IMPLANT
NEEDLE HYPO 22GX1.5 SAFETY (NEEDLE) ×2 IMPLANT
NEEDLE INSUFFLATION 14GA 120MM (NEEDLE) ×2 IMPLANT
OBTURATOR OPTICAL STANDARD 8MM (TROCAR) ×2
OBTURATOR OPTICAL STND 8 DVNC (TROCAR) ×2
OBTURATOR OPTICALSTD 8 DVNC (TROCAR) ×2 IMPLANT
PACK LAP CHOLECYSTECTOMY (MISCELLANEOUS) ×2 IMPLANT
PENCIL SMOKE EVACUATOR (MISCELLANEOUS) ×2 IMPLANT
SEAL CANN UNIV 5-8 DVNC XI (MISCELLANEOUS) ×4 IMPLANT
SEAL XI 5MM-8MM UNIVERSAL (MISCELLANEOUS) ×4
SET TUBE FILTERED XL AIRSEAL (SET/KITS/TRAYS/PACK) IMPLANT
SET TUBE SMOKE EVAC HIGH FLOW (TUBING) ×2 IMPLANT
SOL ELECTROSURG ANTI STICK (MISCELLANEOUS) ×2
SOLUTION ELECTROSURG ANTI STCK (MISCELLANEOUS) ×2 IMPLANT
SPONGE T-LAP 18X18 ~~LOC~~+RFID (SPONGE) ×2 IMPLANT
STAPLER CANNULA SEAL DVNC XI (STAPLE) ×2 IMPLANT
STAPLER CANNULA SEAL XI (STAPLE) ×2
SUT MNCRL 4-0 (SUTURE) ×2
SUT MNCRL 4-0 27XMFL (SUTURE) ×2
SUT STRATAFIX PDS 30 CT-1 (SUTURE) ×2 IMPLANT
SUT VIC AB 3-0 SH 27 (SUTURE) ×2
SUT VIC AB 3-0 SH 27X BRD (SUTURE) IMPLANT
SUT VICRYL 0 UR6 27IN ABS (SUTURE) ×4 IMPLANT
SUT VLOC 90 2/L VL 12 GS22 (SUTURE) ×4 IMPLANT
SUT VLOC 90 S/L VL9 GS22 (SUTURE) IMPLANT
SUTURE MNCRL 4-0 27XMF (SUTURE) ×2 IMPLANT
SYS BAG RETRIEVAL 10MM (BASKET) ×2
SYSTEM BAG RETRIEVAL 10MM (BASKET) IMPLANT
TAPE TRANSPORE STRL 2 31045 (GAUZE/BANDAGES/DRESSINGS) ×2 IMPLANT
TRAP FLUID SMOKE EVACUATOR (MISCELLANEOUS) ×2 IMPLANT
TRAY FOLEY SLVR 16FR LF STAT (SET/KITS/TRAYS/PACK) ×2 IMPLANT
WATER STERILE IRR 500ML POUR (IV SOLUTION) ×2 IMPLANT

## 2022-09-04 NOTE — Interval H&P Note (Signed)
History and Physical Interval Note:  09/04/2022 7:14 AM  Jessica Keith  has presented today for surgery, with the diagnosis of ventral hernia redicible 3-10 cm.  The various methods of treatment have been discussed with the patient and family. After consideration of risks, benefits and other options for treatment, the patient has consented to  Procedure(s): XI ROBOTIC Butler (N/A) as a surgical intervention.  The patient's history has been reviewed, patient examined, no change in status, stable for surgery.  I have reviewed the patient's chart and labs.  Questions were answered to the patient's satisfaction.     Renley Gutman

## 2022-09-04 NOTE — Anesthesia Preprocedure Evaluation (Signed)
Anesthesia Evaluation  Patient identified by MRN, date of birth, ID band Patient awake    Reviewed: Allergy & Precautions, NPO status , Patient's Chart, lab work & pertinent test results  History of Anesthesia Complications Negative for: history of anesthetic complications  Airway Mallampati: II  TM Distance: >3 FB Neck ROM: Full    Dental no notable dental hx. (+) Teeth Intact   Pulmonary neg pulmonary ROS, neg sleep apnea, neg COPD, Patient abstained from smoking.Not current smoker   Pulmonary exam normal breath sounds clear to auscultation       Cardiovascular Exercise Tolerance: Good METS(-) hypertension(-) CAD and (-) Past MI negative cardio ROS (-) dysrhythmias  Rhythm:Regular Rate:Normal - Systolic murmurs    Neuro/Psych negative neurological ROS  negative psych ROS   GI/Hepatic ,neg GERD  ,,(+)     (-) substance abuse   Patient had diarrhea x 1 this morning, some nausea. Initially concern for viral gastroenteritis, however she has not had any more diarrhea past the one episode, feels overall well and afebrile. Has not vomited. She attributes her nausea to her waking up multiple times overnight to care for her infant, and being unable to snack like she normally does at these times, due to NPO restrictions.   Endo/Other  neg diabetes    Renal/GU negative Renal ROS     Musculoskeletal   Abdominal  (+) + obese  Peds  Hematology   Anesthesia Other Findings Past Medical History: 01/17/2022: Labor and delivery, indication for care No date: Umbilical hernia  Reproductive/Obstetrics                              Anesthesia Physical Anesthesia Plan  ASA: 2  Anesthesia Plan: General   Post-op Pain Management: Gabapentin PO (pre-op)*, Tylenol PO (pre-op)* and Toradol IV (intra-op)*   Induction: Intravenous and Rapid sequence  PONV Risk Score and Plan: 4 or greater and Ondansetron,  Dexamethasone, Midazolam, Propofol infusion and TIVA  Airway Management Planned: Oral ETT and Video Laryngoscope Planned  Additional Equipment: None  Intra-op Plan:   Post-operative Plan: Extubation in OR  Informed Consent: I have reviewed the patients History and Physical, chart, labs and discussed the procedure including the risks, benefits and alternatives for the proposed anesthesia with the patient or authorized representative who has indicated his/her understanding and acceptance.     Dental advisory given  Plan Discussed with: CRNA and Surgeon  Anesthesia Plan Comments: (I discussed the potential risks of proceeding with today's elective surgery if the patient does in fact have a viral GI illness, however based on her story and clinical picture, this seems less likely. She ultimately wishes to proceed, and I feel this is reasonable and won't cause undue harm.   Discussed risks of anesthesia with patient, including PONV, sore throat, lip/dental/eye damage. Rare risks discussed as well, such as cardiorespiratory and neurological sequelae, and allergic reactions. Discussed the role of CRNA in patient's perioperative care. Patient understands.)         Anesthesia Quick Evaluation

## 2022-09-04 NOTE — Anesthesia Postprocedure Evaluation (Signed)
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  Procedure(s) Performed: XI ROBOTIC ASSISTED VENTRAL HERNIA INSERTION OF MESH (Abdomen)  Patient location during evaluation: PACU Anesthesia Type: General Level of consciousness: awake and alert Pain management: pain level controlled Vital Signs Assessment: post-procedure vital signs reviewed and stable Respiratory status: spontaneous breathing, nonlabored ventilation, respiratory function stable and patient connected to nasal cannula oxygen Cardiovascular status: blood pressure returned to baseline and stable Postop Assessment: no apparent nausea or vomiting Anesthetic complications: no   No notable events documented.   Last Vitals:  Vitals:   09/04/22 1100 09/04/22 1115  BP: (!) 97/58 97/67  Pulse: 73 72  Resp: 18 16  Temp:  (!) 36.4 C  SpO2: 100% 98%    Last Pain:  Vitals:   09/04/22 1115  TempSrc:   PainSc: 3                  Arita Miss

## 2022-09-04 NOTE — Discharge Instructions (Addendum)
Discharge Instructions: 1.  Patient may shower, but do not scrub wounds heavily and dab dry only. 2.  Do not submerge wounds in pool/tub until fully healed. 3.  Do not apply ointments or hydrogen peroxide to the wounds. 4.  May apply ice packs to the wounds for comfort. 5.  Do not drive while taking narcotics for pain control.  Prior to driving, make sure you are able to rotate right and left to look at blindspots without significant pain or discomfort. 6.  No heavy lifting or pushing of more than 10-15 lbs for 6 weeks.   AMBULATORY SURGERY  DISCHARGE INSTRUCTIONS   The drugs that you were given will stay in your system until tomorrow so for the next 24 hours you should not:  Drive an automobile Make any legal decisions Drink any alcoholic beverage   You may resume regular meals tomorrow.  Today it is better to start with liquids and gradually work up to solid foods.  You may eat anything you prefer, but it is better to start with liquids, then soup and crackers, and gradually work up to solid foods.   Please notify your doctor immediately if you have any unusual bleeding, trouble breathing, redness and pain at the surgery site, drainage, fever, or pain not relieved by medication.     Additional Instructions:

## 2022-09-04 NOTE — Anesthesia Procedure Notes (Signed)
Procedure Name: Intubation Date/Time: 09/04/2022 7:39 AM  Performed by: Cammie Sickle, CRNAPre-anesthesia Checklist: Patient identified, Patient being monitored, Timeout performed, Emergency Drugs available and Suction available Patient Re-evaluated:Patient Re-evaluated prior to induction Oxygen Delivery Method: Circle system utilized Preoxygenation: Pre-oxygenation with 100% oxygen Induction Type: IV induction, Rapid sequence and Cricoid Pressure applied Laryngoscope Size: 3 and McGraph Grade View: Grade I Tube type: Oral Tube size: 6.5 mm Number of attempts: 1 Airway Equipment and Method: Stylet Placement Confirmation: ETT inserted through vocal cords under direct vision, positive ETCO2 and breath sounds checked- equal and bilateral Secured at: 21 cm Tube secured with: Tape Dental Injury: Teeth and Oropharynx as per pre-operative assessment  Comments: Atraumatic, Smooth intubation, no complications noted

## 2022-09-04 NOTE — Transfer of Care (Signed)
Immediate Anesthesia Transfer of Care Note  Patient: Jessica Keith  Procedure(s) Performed: XI ROBOTIC ASSISTED VENTRAL HERNIA INSERTION OF MESH (Abdomen)  Patient Location: PACU  Anesthesia Type:General  Level of Consciousness: awake  Airway & Oxygen Therapy: Patient Spontanous Breathing  Post-op Assessment: Report given to RN and Post -op Vital signs reviewed and stable  Post vital signs: Reviewed and stable  Last Vitals:  Vitals Value Taken Time  BP 148/98 09/04/22 1016  Temp 36.3 C 09/04/22 1012  Pulse 87 09/04/22 1019  Resp 15 09/04/22 1014  SpO2 100 % 09/04/22 1019  Vitals shown include unvalidated device data.  Last Pain:  Vitals:   09/04/22 1012  TempSrc:   PainSc: Asleep         Complications: No notable events documented.

## 2022-09-04 NOTE — Op Note (Signed)
Procedure Date:  09/04/2022  Pre-operative Diagnosis:  Ventral hernias -- umbilical and supraumbilical  Post-operative Diagnosis: Ventral hernias, total defect length of 6 cm  Procedure:  Robotic assisted ventral Hernia Repair with mesh  Surgeon:  Melvyn Neth, MD  Anesthesia:  General endotracheal  Estimated Blood Loss:  15 ml  Specimens:  None  Complications:  None  Indications for Procedure:  This is a 22 y.o. female who presents with a umbilical and supraumbilical hernia.  The options of surgery versus observation were reviewed with the patient and/or family. The risks of bleeding, abscess or infection, recurrence of symptoms, potential for an open procedure, injury to surrounding structures, and chronic pain were all discussed with the patient and was willing to proceed.  Description of Procedure: The patient was correctly identified in the preoperative area and brought into the operating room.  The patient was placed supine with VTE prophylaxis in place.  Appropriate time-outs were performed.  Anesthesia was induced and the patient was intubated.  Appropriate antibiotics were infused.  The abdomen was prepped and draped in a sterile fashion. The patient's hernia defects were marked with a marking pen.  A Veress needle was introduced in the left upper quadrant and pneumoperitoneum was obtained with appropriate pressures.  Using Optiview technique, an 8 mm port was introduced in the left lateral abdominal wall without complications.  Then, a 12 mm port was introduced in the left upper quadrant and an 8 mm port in the left lower quadrant under direct visualization.  The DaVinci platform was docked, camera targeted, and instruments placed under direct visualization.  The patient's hernias were fully reduced and the peritoneum and preperitoneal fat were dissected and resected to allow better exposure of the hernia defect and for better mesh placement.  Overall the patient had multiple  small hernia defects extending from the umbilicus superiorly towards the epigastric area.  The total length from end to end of the defects was measured to be 6 cm.  A 4.5 inch Bard Ventralight ST Echo mesh, a 0 Stratafix suture, and three 2-0 V-loc sutures were inserted through the 12 mm port under direct visualization.The hernia defects were closed using the stratafix suture.  A PMI was brought through the center of the hernia defects and the positioning system of the mesh was passed through.  This allowed the mesh to splay open and be centered over the repair site with good overlap.  The mesh was then sutured in place circumferentially and through the center of the mesh using the V-loc sutures.  All needles and the positioning system were then removed through the 12 mm port without complications.  The preperitoneal fat was placed in an Endocatch bag.  The DaVinci platform was then undocked and instruments removed.    50 ml of Exparel solution mixed with 0.5% bupivacaine with epi was infiltrated around the mesh edges, hernia repair site, and port sites.  The 12 mm port was removed and the bag retrieved.  The fascia was closed under direct visualization utilizing an Endo Close technique with 0 Vicryl suture.  The 8 mm ports were removed. The 12 mm incision was closed using 3-0 Vicryl and 4-0 Monocryl, and the other port incisions were closed with 4-0 Monocryl.  The wounds were cleaned and sealed with DermaBond.  The patient was emerged from anesthesia and extubated and brought to the recovery room for further management.  The patient tolerated the procedure well and all counts were correct at the end of the  case.   Melvyn Neth, MD

## 2022-09-10 ENCOUNTER — Encounter: Payer: Self-pay | Admitting: Surgery

## 2022-09-15 ENCOUNTER — Telehealth: Payer: Self-pay | Admitting: *Deleted

## 2022-09-15 NOTE — Telephone Encounter (Signed)
Faxed FMLA to [facility] at [phone]

## 2022-09-19 ENCOUNTER — Encounter: Payer: Self-pay | Admitting: Surgery

## 2022-09-19 ENCOUNTER — Ambulatory Visit (INDEPENDENT_AMBULATORY_CARE_PROVIDER_SITE_OTHER): Payer: Medicaid Other | Admitting: Surgery

## 2022-09-19 VITALS — BP 124/90 | HR 88 | Temp 98.0°F | Ht 63.0 in | Wt 213.0 lb

## 2022-09-19 DIAGNOSIS — K439 Ventral hernia without obstruction or gangrene: Secondary | ICD-10-CM | POA: Diagnosis not present

## 2022-09-19 DIAGNOSIS — Z419 Encounter for procedure for purposes other than remedying health state, unspecified: Secondary | ICD-10-CM | POA: Diagnosis not present

## 2022-09-19 DIAGNOSIS — R1011 Right upper quadrant pain: Secondary | ICD-10-CM | POA: Diagnosis not present

## 2022-09-19 DIAGNOSIS — K429 Umbilical hernia without obstruction or gangrene: Secondary | ICD-10-CM

## 2022-09-19 DIAGNOSIS — R7989 Other specified abnormal findings of blood chemistry: Secondary | ICD-10-CM

## 2022-09-19 DIAGNOSIS — Z09 Encounter for follow-up examination after completed treatment for conditions other than malignant neoplasm: Secondary | ICD-10-CM | POA: Diagnosis not present

## 2022-09-19 NOTE — Patient Instructions (Addendum)
We will get you scheduled for a HIDA scan to look at how your gallbladder functions. This will be done at [facility].   We have scheduled you for a HIDA Scan to look at the function of your gallbladder. This has been scheduled for 09/26/22  at [facility]. You will need to arrive at the [place] entrance at 8:00 am.  Bring a list of medications with you to your appointment and have nothing to eat or drink after midnight prior to your testing. Do NOT take any narcotic pain medication within 24 hours of this test, as it will alter the results of the HIDA Scan.  If you need to reschedule your Scan, you may do so by calling [phone].  _________________________________________________________________ If your HIDA scan is negative we will refer you to gastroenterology. If it is positive we will see you back here to discuss having gallbladder surgery.   We will call you with the results.   _________________________________________________________________  Gallbladder Eating Plan High blood cholesterol, obesity, a sedentary lifestyle, an unhealthy diet, and diabetes are risk factors for developing gallstones. If you have a gallbladder condition, you may have trouble digesting fats and tolerating high fat intake. Eating a low-fat diet can help reduce your symptoms and may be helpful before and after having surgery to remove your gallbladder (cholecystectomy). Your health care provider may recommend that you work with a dietitian to help you reduce the amount of fat in your diet. What are tips for following this plan? General guidelines Limit your fat intake to less than 30% of your total daily calories. If you eat around 1,800 calories each day, this means eating less than 60 grams (g) of fat per day. Fat is an important part of a healthy diet. Eating a low-fat diet can make it hard to maintain a healthy body weight. Ask your dietitian how much fat, calories, and other nutrients you need each day. Eat small,  frequent meals throughout the day instead of three large meals. Drink at least 8-10 cups (1.9-2.4 L) of fluid a day. Drink enough fluid to keep your urine pale yellow. If you drink alcohol: Limit how much you have to: 0-1 drink a day for women who are not pregnant. 0-2 drinks a day for men. Know how much alcohol is in a drink. In the [place] one drink equals one 12 oz bottle of beer (355 mL), one 5 oz glass of wine (148 mL), or one 1 oz glass of hard liquor (44 mL). Reading food labels  Check nutrition facts on food labels for the amount of fat per serving. Choose foods with less than 3 grams of fat per serving. Shopping Choose nonfat and low-fat healthy foods. Look for the words "nonfat," "low-fat," or "fat-free." Avoid buying processed or prepackaged foods. Cooking Cook using low-fat methods, such as baking, broiling, grilling, or boiling. Cook with small amounts of healthy fats, such as olive oil, grapeseed oil, canola oil, avocado oil, or sunflower oil. What foods are recommended?  All fresh, frozen, or canned fruits and vegetables. Whole grains. Low-fat or nonfat (skim) milk and yogurt. Lean meat, skinless poultry, fish, eggs, and beans. Low-fat protein supplement powders or drinks. Spices and herbs. The items listed above may not be a complete list of foods and beverages you can eat and drink. Contact a dietitian for more information. What foods are not recommended? High-fat foods. These include baked goods, fast food, fatty cuts of meat, ice cream, french toast, sweet rolls, pizza, cheese bread,  foods covered with butter, creamy sauces, or cheese. Fried foods. These include french fries, tempura, battered fish, breaded chicken, fried breads, and sweets. Foods that cause bloating and gas. The items listed above may not be a complete list of foods that you should avoid. Contact a dietitian for more information. Summary A low-fat diet can be helpful if you have a gallbladder  condition, or before and after gallbladder surgery. Limit your fat intake to less than 30% of your total daily calories. This is about 60 g of fat if you eat 1,800 calories each day. Eat small, frequent meals throughout the day instead of three large meals. This information is not intended to replace advice given to you by your health care provider. Make sure you discuss any questions you have with your health care provider. Document Revised: 06/21/2021 Document Reviewed: 06/21/2021 Elsevier Patient Education  [employer].

## 2022-09-19 NOTE — Progress Notes (Signed)
09/19/2022  History of Present Illness: Jessica Keith is a 22 y.o. female s/p robotic assisted repair of ventral hernias on 09/04/22.  She had multiple small defects and total length of repair was 6 cm.  She presents today for follow up.  She reports she's been doing well from hernia standpoint, with some soreness at the left upper quadrant incision and the midline itself, but no worsening pain and no symptoms of bulging anymore.  However, she does report having episodes of RUQ pain associated with nausea and vomiting.  She reports having some episodes before surgery and she had a visit in ED on 02/15/22 for this.  Workup at that point included a RUQ ultrasound, CT scan, and MRCP, all which were negative for cholelithiasis or cholecystitis.  The pain comes up after eating greasy foods.  The pain does not radiate.  Past Medical History: Past Medical History:  Diagnosis Date   Labor and delivery, indication for care [id number]   Umbilical hernia      Past Surgical History: Past Surgical History:  Procedure Laterality Date   ADENOIDECTOMY  2002   INSERTION OF MESH N/A 09/04/2022   Procedure: INSERTION OF MESH;  Surgeon: Olean Ree, MD;  Location: [facility] ORS;  Service: General;  Laterality: N/A;   XI ROBOTIC ASSISTED VENTRAL HERNIA N/A 09/04/2022   Procedure: XI ROBOTIC ASSISTED VENTRAL HERNIA;  Surgeon: Olean Ree, MD;  Location: [facility] ORS;  Service: General;  Laterality: N/A;    Home Medications: Prior to Admission medications   Medication Sig Start Date End Date Taking? Authorizing Provider  acetaminophen (TYLENOL) 500 MG tablet Take 2 tablets (1,000 mg total) by mouth every 6 (six) hours as needed for mild pain. 09/04/22  Yes Tamryn Popko, Jacqulyn Bath, MD  etonogestrel (NEXPLANON) 68 MG IMPL implant 1 each by Subdermal route once.   Yes [provider]  ibuprofen (ADVIL) 800 MG tablet Take 1 tablet (800 mg total) by mouth every 8 (eight) hours as needed for moderate pain. 09/04/22  Yes  Andres Vest, Jacqulyn Bath, MD    Allergies: No Known Allergies  Review of Systems: Review of Systems  Constitutional:  Negative for chills and fever.  Respiratory:  Negative for shortness of breath.   Cardiovascular:  Negative for chest pain.  Gastrointestinal:  Positive for abdominal pain, nausea and vomiting.  Genitourinary:  Negative for dysuria.  Musculoskeletal:  Negative for myalgias.    Physical Exam BP (!) 124/90   Pulse 88   Temp 98 F (36.7 C)   Ht '5\' 3"'$  (1.6 m)   Wt 213 lb (96.6 kg)   LMP 08/21/2022 (Exact Date)   SpO2 100%   BMI 37.73 kg/m  CONSTITUTIONAL: No acute distress, well nourished. HEENT:  Normocephalic, atraumatic, extraocular motion intact. RESPIRATORY:  Lungs are clear, and breath sounds are equal bilaterally. Normal respiratory effort without pathologic use of accessory muscles. CARDIOVASCULAR: Heart is regular without murmurs, gallops, or rubs. GI: The abdomen is soft, non-distended, currently with only some soreness at the LUQ incision.  Negative Murphy's sign.  Incisions are healing well and are clean, dry, intact.  No evidence of hernia recurrence. NEUROLOGIC:  Motor and sensation is grossly normal.  Cranial nerves are grossly intact. PSYCH:  Alert and oriented to person, place and time. Affect is normal.  Labs/Imaging: Ultrasound RUQ on 02/15/22: IMPRESSION: No evidence of cholelithiasis or acute cholecystitis.  CT chest/abdomen/pelvis on 02/16/22: IMPRESSION: CTA of the chest: No evidence of pulmonary emboli. No acute abnormality seen. CT of the abdomen and pelvis: Small  supraumbilical fat containing hernia. No other focal abnormality is seen.  MRCP on 02/16/22: IMPRESSION: No acute findings. Small umbilical hernia, which contains only fat.  Assessment and Plan: This is a 22 y.o. female s/p robotic assisted ventral hernias repair, also with RUQ pain.  --Discussed with the patient that she's healing well from the hernia standpoint, with the  incisions doing well and no evidence of infection or recurrence.   --The patient has been having episodes of RUQ pain associated with nausea and vomiting.  These were happening before surgery as well.  Her prior workup last year was negative for cholelithiasis or cholecystitis.  However, no HIDA scan was done to evaluate for dyskinesia.  Discussed with the patient that we can order this for now to further evaluate.  If there is biliary dyskinesia, this would explain her pain and other symptoms and we would recommend cholecystectomy.  If negative, then would refer to GI for further evaluation.  She's in agreement with this. --Will call her with results of HIDA scan and set up either follow up appointment with me or referral to GI.  I spent 30 minutes dedicated to the care of this patient on the date of this encounter to include pre-visit review of records, face-to-face time with the patient discussing diagnosis and management, and any post-visit coordination of care.   Melvyn Neth, [facility]

## 2022-09-29 ENCOUNTER — Encounter
Admission: RE | Admit: 2022-09-29 | Discharge: 2022-09-29 | Disposition: A | Discharge status: Home / Self Care | Payer: Medicaid Other | Source: Ambulatory Visit | Attending: Surgery | Admitting: Surgery

## 2022-09-29 ENCOUNTER — Encounter: Payer: Self-pay | Admitting: Radiology

## 2022-09-29 DIAGNOSIS — R7989 Other specified abnormal findings of blood chemistry: Secondary | ICD-10-CM | POA: Insufficient documentation

## 2022-09-29 DIAGNOSIS — R1011 Right upper quadrant pain: Secondary | ICD-10-CM | POA: Diagnosis not present

## 2022-09-29 MED ORDER — TECHNETIUM TC 99M MEBROFENIN IV KIT
5.0000 | PACK | Freq: Once | INTRAVENOUS | Status: AC
  Administered 2022-09-29: 5.24 via INTRAVENOUS

## 2022-10-01 ENCOUNTER — Telehealth: Payer: Self-pay

## 2022-10-01 DIAGNOSIS — R1011 Right upper quadrant pain: Secondary | ICD-10-CM

## 2022-10-01 NOTE — Telephone Encounter (Signed)
Patient notified of HIDA scan-referral placed to [facility] gastroenterology per patients request for abdominal pain. Contact number provided to [facility].

## 2022-10-03 ENCOUNTER — Ambulatory Visit: Payer: No Typology Code available for payment source | Admitting: Physician Assistant

## 2022-10-20 DIAGNOSIS — Z419 Encounter for procedure for purposes other than remedying health state, unspecified: Secondary | ICD-10-CM | POA: Diagnosis not present

## 2022-11-19 DIAGNOSIS — Z419 Encounter for procedure for purposes other than remedying health state, unspecified: Secondary | ICD-10-CM | POA: Diagnosis not present

## 2022-12-20 DIAGNOSIS — Z419 Encounter for procedure for purposes other than remedying health state, unspecified: Secondary | ICD-10-CM | POA: Diagnosis not present

## 2023-01-19 DIAGNOSIS — Z419 Encounter for procedure for purposes other than remedying health state, unspecified: Secondary | ICD-10-CM | POA: Diagnosis not present

## 2023-01-29 DIAGNOSIS — N898 Other specified noninflammatory disorders of vagina: Secondary | ICD-10-CM | POA: Diagnosis not present

## 2023-01-29 DIAGNOSIS — Z202 Contact with and (suspected) exposure to infections with a predominantly sexual mode of transmission: Secondary | ICD-10-CM | POA: Diagnosis not present

## 2023-01-30 ENCOUNTER — Telehealth: Payer: Medicaid Other | Admitting: Physician Assistant

## 2023-01-30 DIAGNOSIS — A599 Trichomoniasis, unspecified: Secondary | ICD-10-CM | POA: Diagnosis not present

## 2023-01-30 MED ORDER — METRONIDAZOLE 500 MG PO TABS: 500.0000 mg | ORAL_TABLET | Freq: Two times a day (BID) | ORAL | 0 refills | Status: AC

## 2023-01-30 NOTE — Progress Notes (Signed)
E-Visit for Vaginal Symptoms  We are sorry that you are not feeling well. Here is how we plan to help! Based on what you shared with me it looks like you: Trichomoniasis  Vaginosis is an inflammation of the vagina that can result in discharge, itching and pain. The cause is usually a change in the normal balance of vaginal bacteria or an infection. Vaginosis can also result from reduced estrogen levels after menopause.  The most common causes of vaginosis are:   Bacterial vaginosis which results from an overgrowth of one on several organisms that are normally present in your vagina.   Yeast infections which are caused by a naturally occurring fungus called candida.   Vaginal atrophy (atrophic vaginosis) which results from the thinning of the vagina from reduced estrogen levels after menopause.   Trichomoniasis which is caused by a parasite and is commonly transmitted by sexual intercourse.  Factors that increase your risk of developing vaginosis include: Medications, such as antibiotics and steroids Uncontrolled diabetes Use of hygiene products such as bubble bath, vaginal spray or vaginal deodorant Douching Wearing damp or tight-fitting clothing Using an intrauterine device (IUD) for birth control Hormonal changes, such as those associated with pregnancy, birth control pills or menopause Sexual activity Having a sexually transmitted infection  Your treatment plan is Metronidazole or Flagyl 500mg  twice a day for 7 days.  I have electronically sent this prescription into the pharmacy that you have chosen.  Be sure to take all of the medication as directed. Stop taking any medication if you develop a rash, tongue swelling or shortness of breath. Mothers who are breast feeding should consider pumping and discarding their breast milk while on these antibiotics. However, there is no consensus that infant exposure at these doses would be harmful.  Remember that medication creams can weaken  latex condoms. Marland Kitchen   HOME CARE:  Good hygiene may prevent some types of vaginosis from recurring and may relieve some symptoms:  Avoid baths, hot tubs and whirlpool spas. Rinse soap from your outer genital area after a shower, and dry the area well to prevent irritation. Don't use scented or harsh soaps, such as those with deodorant or antibacterial action. Avoid irritants. These include scented tampons and pads. Wipe from front to back after using the toilet. Doing so avoids spreading fecal bacteria to your vagina.  Other things that may help prevent vaginosis include:  Don't douche. Your vagina doesn't require cleansing other than normal bathing. Repetitive douching disrupts the normal organisms that reside in the vagina and can actually increase your risk of vaginal infection. Douching won't clear up a vaginal infection. Use a latex condom. Both female and female latex condoms may help you avoid infections spread by sexual contact. Wear cotton underwear. Also wear pantyhose with a cotton crotch. If you feel comfortable without it, skip wearing underwear to bed. Yeast thrives in [employer] Your symptoms should improve in the next day or two.  GET HELP RIGHT AWAY IF:  You have pain in your lower abdomen ( pelvic area or over your ovaries) You develop nausea or vomiting You develop a fever Your discharge changes or worsens You have persistent pain with intercourse You develop shortness of breath, a rapid pulse, or you faint.  These symptoms could be signs of problems or infections that need to be evaluated by a medical provider now.  MAKE SURE YOU   Understand these instructions. Will watch your condition. Will get help right away if you are not doing  well or get worse.  Thank you for choosing an e-visit.  Your e-visit answers were reviewed by a board certified advanced clinical practitioner to complete your personal care plan. Depending upon the condition, your plan could  have included both over the counter or prescription medications.  Please review your pharmacy choice. Make sure the pharmacy is open so you can pick up prescription now. If there is a problem, you may contact your provider through [employer] and have the prescription routed to another pharmacy.  Your safety is important to [place]. If you have drug allergies check your prescription carefully.   For the next 24 hours you can use MyChart to ask questions about today's visit, request a non-urgent call back, or ask for a work or school excuse. You will get an email in the next two days asking about your experience. I hope that your e-visit has been valuable and will speed your recovery.  I have spent 5 minutes in review of e-visit questionnaire, review and updating patient chart, medical decision making and response to patient.   Margaretann Loveless, PA-C

## 2023-02-02 ENCOUNTER — Ambulatory Visit: Payer: Commercial Managed Care - PPO

## 2023-02-02 NOTE — Progress Notes (Deleted)
    NURSE VISIT NOTE  Subjective:    Patient ID: Raenell Mensing, female    DOB: 10/03/2000, 22 y.o.   MRN: [id number]  HPI  Patient is a 22 y.o. G70P1001 female who presents for {pe vag discharge desc:315065} vaginal discharge for *** {gen duration:315003}. Denies abnormal vaginal bleeding or significant pelvic pain or fever. {Actions; denies/reports/admits to:19208} {UTI Symptoms:210800002}. Patient {has/denies:315300} history of known exposure to STD.   Objective:    LMP 02/13/2022 (Exact Date)    @THIS  VISIT ONLY@  Assessment:   No diagnosis found.  {vaginitis type:315262}  Plan:   GC and chlamydia DNA  probe sent to lab. Treatment: {vaginitis tx:315263} ROV prn if symptoms persist or worsen.   Cornelius Moras, CMA

## 2023-02-19 DIAGNOSIS — Z419 Encounter for procedure for purposes other than remedying health state, unspecified: Secondary | ICD-10-CM | POA: Diagnosis not present

## 2023-03-18 ENCOUNTER — Other Ambulatory Visit: Payer: Self-pay

## 2023-03-18 ENCOUNTER — Emergency Department
Admission: EM | Admit: 2023-03-18 | Discharge: 2023-03-18 | Disposition: A | Discharge status: Home / Self Care | Payer: Commercial Managed Care - PPO | Attending: Emergency Medicine | Admitting: Emergency Medicine

## 2023-03-18 ENCOUNTER — Emergency Department: Payer: Commercial Managed Care - PPO

## 2023-03-18 DIAGNOSIS — R1013 Epigastric pain: Secondary | ICD-10-CM | POA: Insufficient documentation

## 2023-03-18 DIAGNOSIS — R112 Nausea with vomiting, unspecified: Secondary | ICD-10-CM | POA: Insufficient documentation

## 2023-03-18 DIAGNOSIS — R1011 Right upper quadrant pain: Secondary | ICD-10-CM | POA: Insufficient documentation

## 2023-03-18 DIAGNOSIS — R110 Nausea: Secondary | ICD-10-CM

## 2023-03-18 LAB — CBC
HCT: 39.5 % (ref 36.0–46.0)
Hemoglobin: 13 g/dL (ref 12.0–15.0)
MCH: 27.7 pg (ref 26.0–34.0)
MCHC: 32.9 g/dL (ref 30.0–36.0)
MCV: 84 fL (ref 80.0–100.0)
Platelets: 277 10*3/uL (ref 150–400)
RBC: 4.7 MIL/uL (ref 3.87–5.11)
RDW: 13.6 % (ref 11.5–15.5)
WBC: 7.2 10*3/uL (ref 4.0–10.5)
nRBC: 0 % (ref 0.0–0.2)

## 2023-03-18 LAB — COMPREHENSIVE METABOLIC PANEL
ALT: 19 U/L (ref 0–44)
AST: 19 U/L (ref 15–41)
Albumin: 4 g/dL (ref 3.5–5.0)
Alkaline Phosphatase: 79 U/L (ref 38–126)
Anion gap: 1 — ABNORMAL LOW (ref 5–15)
BUN: 12 mg/dL (ref 6–20)
CO2: 21 mmol/L — ABNORMAL LOW (ref 22–32)
Calcium: 8.5 mg/dL — ABNORMAL LOW (ref 8.9–10.3)
Chloride: 111 mmol/L (ref 98–111)
Creatinine, Ser: 0.71 mg/dL (ref 0.44–1.00)
GFR, Estimated: >60 mL/min (ref >60)
Glucose, Bld: 93 mg/dL (ref 70–99)
Potassium: 3.6 mmol/L (ref 3.5–5.1)
Sodium: 133 mmol/L — ABNORMAL LOW (ref 135–145)
Total Bilirubin: 0.6 mg/dL (ref 0.3–1.2)
Total Protein: 7.6 g/dL (ref 6.5–8.1)

## 2023-03-18 LAB — URINALYSIS, ROUTINE W REFLEX MICROSCOPIC
Bilirubin Urine: NEGATIVE
Glucose, UA: NEGATIVE mg/dL
Hgb urine dipstick: NEGATIVE
Ketones, ur: NEGATIVE mg/dL
Leukocytes,Ua: NEGATIVE
Nitrite: NEGATIVE
Protein, ur: NEGATIVE mg/dL
Specific Gravity, Urine: 1.029 (ref 1.005–1.030)
pH: 5 (ref 5.0–8.0)

## 2023-03-18 LAB — LIPASE, BLOOD: Lipase: 30 U/L (ref 11–51)

## 2023-03-18 LAB — POC URINE PREG, ED: Preg Test, Ur: NEGATIVE

## 2023-03-18 NOTE — ED Triage Notes (Signed)
Pt c/o abd pain with diarrhea. Pt reports hx of elevated liver enzymes and was hospitalized last year for that issue.

## 2023-03-18 NOTE — Discharge Instructions (Signed)
You were seen in the emergency department today for your abdominal pain and nausea without vomiting.  Your liver, gallbladder, and pancreas all look normal today.  The rest of your workup was reassuring.  Please follow-up with your primary care provider for any ongoing evaluation.  Please return for any severe worsening symptoms, vomiting, or fever associated with the pain.

## 2023-03-18 NOTE — ED Provider Notes (Signed)
[facility] Provider Note    Event Date/Time   First MD Initiated Contact with Patient 03/18/23 1357     (approximate)   History   Abdominal Pain   HPI Jessica Keith is a 22 y.o. female presenting today for abdominal pain.  Patient states she has had intermittent right upper quadrant epigastric pain over the past 3 days.  She denies symptoms being worse after eating or with movement and that they are random.  She has had some nausea in the first day but none since.  1 episode of diarrhea yesterday but nothing since.  Otherwise denies fever, chills, vomiting, other abdominal pain, dysuria, hematuria, constipation.  Currently not having any pain or nausea symptoms.  Per chart review, patient has had elevated LFTs in the past.  She has undergone right upper quadrant ultrasounds as well as MRI which was all reassuring and improvement in her LFTs in the past.     Physical Exam   Triage Vital Signs: ED Triage Vitals  Encounter Vitals Group     BP 03/18/23 1342 130/81     Systolic BP Percentile --      Diastolic BP Percentile --      Pulse Rate 03/18/23 1342 93     Resp 03/18/23 1342 16     Temp 03/18/23 1342 99.4 F (37.4 C)     Temp Source 03/18/23 1342 Oral     SpO2 03/18/23 1342 95 %     Weight --      Height --      Head Circumference --      Peak Flow --      Pain Score 03/18/23 1343 5     Pain Loc --      Pain Education --      Exclude from Growth Chart --     Most recent vital signs: Vitals:   03/18/23 1342  BP: 130/81  Pulse: 93  Resp: 16  Temp: 99.4 F (37.4 C)  SpO2: 95%   Physical Exam: I have reviewed the vital signs and nursing notes. General: Awake, alert, no acute distress.  Nontoxic appearing. Head:  Atraumatic, normocephalic.   ENT:  EOM intact, PERRL. Oral mucosa is pink and moist with no lesions. Neck: Neck is supple with full range of motion, No meningeal signs. Cardiovascular:  RRR, No murmurs. Peripheral pulses  palpable and equal bilaterally. Respiratory:  Symmetrical chest wall expansion.  No rhonchi, rales, or wheezes.  Good air movement throughout.  No use of accessory muscles.   Musculoskeletal:  No cyanosis or edema. Moving extremities with full ROM Abdomen:  Soft, nontender, nondistended. Neuro:  GCS 15, moving all four extremities, interacting appropriately. Speech clear. Psych:  Calm, appropriate.   Skin:  Warm, dry, no rash.   ED Results / Procedures / Treatments   Labs (all labs ordered are listed, but only abnormal results are displayed) Labs Reviewed  COMPREHENSIVE METABOLIC PANEL - Abnormal; Notable for the following components:      Result Value   Sodium 133 (*)    CO2 21 (*)    Calcium 8.5 (*)    Anion gap 1 (*)    All other components within normal limits  URINALYSIS, ROUTINE W REFLEX MICROSCOPIC - Abnormal; Notable for the following components:   Color, Urine YELLOW (*)    APPearance HAZY (*)    All other components within normal limits  LIPASE, BLOOD  CBC  POC URINE PREG, ED  EKG    RADIOLOGY Independently interpreted right upper quadrant ultrasound with no acute pathology per my interpretation.   PROCEDURES:  Critical Care performed: No  Procedures   MEDICATIONS ORDERED IN ED: Medications - No data to display   IMPRESSION / MDM / ASSESSMENT AND PLAN / ED COURSE  I reviewed the triage vital signs and the nursing notes.                              Differential diagnosis includes, but is not limited to, cholecystitis, symptomatic cholelithiasis, pancreatitis, constipation.  Patient's presentation is most consistent with acute complicated illness / injury requiring diagnostic workup.  Patient is a 22 year old female presenting today for right upper quadrant abdominal pain intermittently with nausea.  No pain symptoms or nausea symptoms on arrival here.  Physical exam unremarkable at this time.  Laboratory workup also reassuring.  Right upper  quadrant ultrasound showed no acute pathology indicating symptomatic cholelithiasis or cholecystitis.  Patient reassessed with stable vital signs and normal physical exam.  Will discharge at this time with follow-up with PCP.  She was given strict return precautions and agreeable with plan.  The patient is on the cardiac monitor to evaluate for evidence of arrhythmia and/or significant heart rate changes. Clinical Course as of 03/18/23 1538  Wed Mar 18, 2023  1425 Comprehensive metabolic panel(!) Mild hypocarbia otherwise reassuring.  LFTs are normal today [DW]  1425 Urinalysis, Routine w reflex microscopic -Urine, Clean Catch(!) No UTI [DW]  1425 Lipase: 30 [DW]  1425 Preg Test, Ur: NEGATIVE [DW]  1538 US Abdomen Limited RUQ (LIVER/GB) Unremarkable per my interpretation [DW]    Clinical Course User Index [DW] Janith Lima, MD     FINAL CLINICAL IMPRESSION(S) / ED DIAGNOSES   Final diagnoses:  Right upper quadrant abdominal pain  Nausea without vomiting     Rx / DC Orders   ED Discharge Orders     None        Note:  This document was prepared using Dragon voice recognition software and may include unintentional dictation errors.   Janith Lima, MD 03/18/23 [phone]

## 2023-03-22 DIAGNOSIS — Z419 Encounter for procedure for purposes other than remedying health state, unspecified: Secondary | ICD-10-CM | POA: Diagnosis not present

## 2023-03-24 ENCOUNTER — Ambulatory Visit: Payer: Commercial Managed Care - PPO | Admitting: Physician Assistant

## 2023-03-31 ENCOUNTER — Ambulatory Visit: Payer: Commercial Managed Care - PPO | Admitting: Certified Nurse Midwife

## 2023-04-21 DIAGNOSIS — Z419 Encounter for procedure for purposes other than remedying health state, unspecified: Secondary | ICD-10-CM | POA: Diagnosis not present

## 2023-04-23 ENCOUNTER — Ambulatory Visit: Payer: Commercial Managed Care - PPO | Admitting: Certified Nurse Midwife

## 2023-04-27 DIAGNOSIS — J029 Acute pharyngitis, unspecified: Secondary | ICD-10-CM | POA: Diagnosis not present

## 2023-04-27 DIAGNOSIS — R059 Cough, unspecified: Secondary | ICD-10-CM | POA: Diagnosis not present

## 2023-04-27 DIAGNOSIS — Z20822 Contact with and (suspected) exposure to covid-19: Secondary | ICD-10-CM | POA: Diagnosis not present

## 2023-04-27 DIAGNOSIS — R509 Fever, unspecified: Secondary | ICD-10-CM | POA: Diagnosis not present

## 2023-05-21 ENCOUNTER — Ambulatory Visit (INDEPENDENT_AMBULATORY_CARE_PROVIDER_SITE_OTHER): Payer: Medicaid Other | Admitting: Obstetrics

## 2023-05-21 ENCOUNTER — Encounter: Payer: Self-pay | Admitting: Obstetrics

## 2023-05-21 ENCOUNTER — Other Ambulatory Visit (HOSPITAL_COMMUNITY)
Admission: RE | Admit: 2023-05-21 | Discharge: 2023-05-21 | Disposition: A | Discharge status: Home / Self Care | Payer: Commercial Managed Care - PPO | Source: Ambulatory Visit | Attending: Obstetrics | Admitting: Obstetrics

## 2023-05-21 VITALS — BP 109/66 | HR 89 | Ht 63.0 in | Wt 224.0 lb

## 2023-05-21 DIAGNOSIS — Z23 Encounter for immunization: Secondary | ICD-10-CM | POA: Diagnosis not present

## 2023-05-21 DIAGNOSIS — Z13220 Encounter for screening for lipoid disorders: Secondary | ICD-10-CM | POA: Diagnosis not present

## 2023-05-21 DIAGNOSIS — Z113 Encounter for screening for infections with a predominantly sexual mode of transmission: Secondary | ICD-10-CM | POA: Diagnosis not present

## 2023-05-21 DIAGNOSIS — Z131 Encounter for screening for diabetes mellitus: Secondary | ICD-10-CM

## 2023-05-21 DIAGNOSIS — Z01419 Encounter for gynecological examination (general) (routine) without abnormal findings: Secondary | ICD-10-CM | POA: Diagnosis not present

## 2023-05-21 NOTE — Progress Notes (Signed)
Outpatient Gynecology Note: Annual Visit   Subjective:    PCP: Doreene Burke, CNM Jessica Keith is a 22 y.o. female G2P1001 who presents for annual wellness visit. She has no concerns today. Would like STI screening as well, is asymptomatic without known exposure. Pt is entering the [employer] on 06/04/23.   Well Woman Visit:  GYN HISTORY:  Patient's last menstrual period was 05/01/2023.     Menstrual History: OB History     Gravida  2   Para  1   Term  1   Preterm  0   AB  0   Living  1      SAB  0   IAB  0   Ectopic  0   Multiple  0   Live Births  1           Menarche age: 50 Patient's last menstrual period was 05/01/2023. Period Cycle (Days): 60 Period Duration (Days): 5-7 Period Pattern: Regular Menstrual Flow: Moderate Menstrual Control: Thin pad Dysmenorrhea: (!) Severe Dysmenorrhea Symptoms: Cramping   Periods are every 60 days, and last 5-7 days, flow is light / moderate / heavy.  Uses pads and can wear it all day.  Cramping is severe.  Cyclic symptoms include: bloating and constipation.  Intermenstrual bleeding, spotting, or discharge? no Urinary incontinence? no  Sexually active: yes  Number of sexual partners: 1  Gender of sexual Partners: male  Social History   Substance and Sexual Activity  Sexual Activity Yes   Partners: Male   Birth control/protection: Implant   Comment: nexplanon   Contraceptive methods:  nexplanon Dyspareunia? no STI history: no STI/HIV testing or immunizations needed? Yes.     Health Maintenance: -Last pap: never ever had   -[facility] of Breast / Colon / Cervical cancer: no -Vaccines:  Immunization History  Administered Date(s) Administered   HPV 9-valent 05/25/2019   HPV Quadrivalent 06/08/2013, 08/25/2013   Hepatitis A, Ped/Adol-2 Dose 06/08/2013   Influenza, Seasonal, Injecte, Preservative Fre 05/21/2023   Influenza,inj,Quad PF,6+ Mos 06/08/2013, 05/25/2019   Meningococcal B, OMV 05/25/2019    Meningococcal Conjugate 06/08/2013   Meningococcal Mcv4o 05/25/2019   PFIZER(Purple Top)SARS-COV-2 Vaccination 10/14/2019   Tdap 10/28/2021   Varicella 01/20/2022   Last Tdap: 2023 / Flu: today  / COVID:  2021/ Gardasil: 2020 / Shingles (50+): no / PCV20:  -Hep C screen: completed -Last lipid / glucose screening: no  > Exercise: running/ jogging, exercises 3 times a week > Dietary Supplements: Folate: No;  Calcium: No}; Vitamin D: No > Body mass index is 39.68 kg/m.  > Recent dental visit No. > Seat Belt Use: Yes.   > Texting and driving? No. > Guns in the house No. > How much do you drink in 1 week? (Amount) / (Type) > Tobacco or other drug use: denied.  Social History   Tobacco Use   Smoking status: Never    Passive exposure: Past   Smokeless tobacco: Never  Substance Use Topics   Alcohol use: Not Currently   Occupation: going to the [employer] in 2 weeks  Lives with mom and child (17mo)  PHQ-2 Score: In last two weeks, how often have you felt: Little interest or pleasure in doing things: Not at all (0) Feeling down, depressed or hopeless: Not at all (0) Score: 0  GAD-2 Over the last 2 weeks, how often have you been bothered by the following problems? Feeling nervous, anxious or on edge: Several days (+1) Not being able to stop  or control worrying: Not at all (0)} Score: 1 _________________________________________________________  Current Outpatient Medications  Medication Sig Dispense Refill   etonogestrel (NEXPLANON) 68 MG IMPL implant 1 each by Subdermal route once.     No current facility-administered medications for this visit.   No Known Allergies  Past Medical History:  Diagnosis Date   Labor and delivery, indication for care 01/17/2022   Umbilical hernia    Past Surgical History:  Procedure Laterality Date   ADENOIDECTOMY  03-30-2001   INSERTION OF MESH N/A 09/04/2022   Procedure: INSERTION OF MESH;  Surgeon: Henrene Dodge, MD;  Location: [facility] OR[facility];   Service: General;  Laterality: N/A;   XI ROBOTIC ASSISTED VENTRAL HERNIA N/A 09/04/2022   Procedure: XI ROBOTIC ASSISTED VENTRAL HERNIA;  Surgeon: Henrene Dodge, MD;  Location: [facility];  Service: General;  Laterality: N/A;   OB History     Gravida  2   Para  1   Term  1   Preterm  0   AB  0   Living  1      SAB  0   IAB  0   Ectopic  0   Multiple  0   Live Births  1           Review Of Systems  Constitutional: Denied constitutional symptoms, night sweats, recent illness, fatigue, fever, insomnia and weight loss.  Eyes: Denied eye symptoms, eye pain, photophobia, vision change and visual disturbance.  Ears/Nose/Throat/Neck: Denied ear, nose, throat or neck symptoms, hearing loss, nasal discharge, sinus congestion and sore throat.  Cardiovascular: Denied cardiovascular symptoms, arrhythmia, chest pain/pressure, edema, exercise intolerance, orthopnea and palpitations.  Respiratory: Denied pulmonary symptoms, asthma, pleuritic pain, productive sputum, cough, dyspnea and wheezing.  Gastrointestinal: Denied, gastro-esophageal reflux, melena, nausea and vomiting.  Genitourinary: Denied genitourinary symptoms including symptomatic vaginal discharge, pelvic relaxation issues, and urinary complaints.  Musculoskeletal: Denied musculoskeletal symptoms, stiffness, swelling, muscle weakness and myalgia.  Dermatologic: Denied dermatology symptoms, rash and scar.  Neurologic: Denied neurology symptoms, dizziness, headache, neck pain and syncope.  Psychiatric: Denied psychiatric symptoms, anxiety and depression.  Endocrine: Denied endocrine symptoms including hot flashes and night sweats.      Objective:    BP 109/66   Pulse 89   Ht 5\' 3"  (1.6 m)   Wt 224 lb (101.6 kg)   LMP 05/01/2023   BMI 39.68 kg/m   Constitutional: Well-developed, well-nourished female in no acute distress Neurological: Alert and oriented to person, place, and time Psychiatric: Mood and affect  appropriate Skin: No rashes or lesions Neck: Supple without masses. Trachea is midline.Thyroid is normal size without masses Lymphatics: No cervical, axillary, supraclavicular, or inguinal adenopathy noted Respiratory: Clear to auscultation bilaterally. Good air movement with normal work of breathing. Cardiovascular: Regular rate and rhythm. Extremities grossly normal, nontender with no edema; pulses regular Gastrointestinal: Soft, nontender, nondistended. No masses or hernias appreciated. No hepatosplenomegaly. No fluid wave. No rebound or guarding. Breast Exam: normal appearance, no masses or tenderness, Inspection negative, No nipple retraction or dimpling, No nipple discharge or bleeding, No axillary or supraclavicular adenopathy, Normal to palpation without dominant masses Genitourinary:         External Genitalia: Normal female genitalia    Vagina: Normal mucosa, no lesions.    Cervix: No lesions, normal size and consistency; no cervical motion tenderness; Pap obtained    Uterus: Normal size and contour; smooth, mobile, NT, retroverted. Adnexae: Non-palpable and non-tender Perineum/Anus: No lesions Rectal: deferred    Assessment/Plan:  Jessica Keith is a 22 y.o. female G2P1001 with normal well-woman gynecologic exam.  -Screenings:  Pap: w/rflx today Labs: A1C, Lipid panel, STI screen PHQ-2 = 0 -Contraception: Nexplanon -Vaccines: UTD -Healthy lifestyle modifications discussed: multivitamin, diet, exercise, sunscreen, tobacco and alcohol use. Emphasized importance of regular physical activity.  -Folate recommendation reviewed.  -All questions answered to patient's satisfaction.  -RTC 1 yr for annual, sooner prn.    Julieanne Manson, DO [facility]

## 2023-05-22 DIAGNOSIS — Z419 Encounter for procedure for purposes other than remedying health state, unspecified: Secondary | ICD-10-CM | POA: Diagnosis not present

## 2023-05-22 LAB — LIPID PANEL
Chol/HDL Ratio: 4.1 ratio (ref 0.0–4.4)
Cholesterol, Total: 161 mg/dL (ref 100–199)
HDL: 39 mg/dL — ABNORMAL LOW (ref >39)
LDL Chol Calc (NIH): 110 mg/dL — ABNORMAL HIGH (ref 0–99)
Triglycerides: 60 mg/dL (ref 0–149)
VLDL Cholesterol Cal: 12 mg/dL (ref 5–40)

## 2023-05-22 LAB — HEP, RPR, HIV PANEL
HIV Screen 4th Generation wRfx: NONREACTIVE
Hepatitis B Surface Ag: NEGATIVE
RPR Ser Ql: NONREACTIVE

## 2023-05-22 LAB — HEMOGLOBIN A1C
Est. average glucose Bld gHb Est-mCnc: 117 mg/dL
Hgb A1c MFr Bld: 5.7 % — ABNORMAL HIGH (ref 4.8–5.6)

## 2023-05-25 LAB — CYTOLOGY - PAP
Chlamydia: NEGATIVE
Comment: NEGATIVE
Comment: NEGATIVE
Comment: NORMAL
Diagnosis: NEGATIVE
Neisseria Gonorrhea: NEGATIVE
Trichomonas: NEGATIVE

## 2023-06-21 DIAGNOSIS — Z419 Encounter for procedure for purposes other than remedying health state, unspecified: Secondary | ICD-10-CM | POA: Diagnosis not present

## 2023-07-22 DIAGNOSIS — Z419 Encounter for procedure for purposes other than remedying health state, unspecified: Secondary | ICD-10-CM | POA: Diagnosis not present

## 2023-08-22 DIAGNOSIS — Z419 Encounter for procedure for purposes other than remedying health state, unspecified: Secondary | ICD-10-CM | POA: Diagnosis not present

## 2023-09-19 DIAGNOSIS — Z419 Encounter for procedure for purposes other than remedying health state, unspecified: Secondary | ICD-10-CM | POA: Diagnosis not present

## 2023-10-31 DIAGNOSIS — Z419 Encounter for procedure for purposes other than remedying health state, unspecified: Secondary | ICD-10-CM | POA: Diagnosis not present

## 2023-11-30 DIAGNOSIS — Z419 Encounter for procedure for purposes other than remedying health state, unspecified: Secondary | ICD-10-CM | POA: Diagnosis not present

## 2023-12-31 DIAGNOSIS — Z419 Encounter for procedure for purposes other than remedying health state, unspecified: Secondary | ICD-10-CM | POA: Diagnosis not present

## 2024-01-30 DIAGNOSIS — Z419 Encounter for procedure for purposes other than remedying health state, unspecified: Secondary | ICD-10-CM | POA: Diagnosis not present

## 2024-02-12 IMAGING — CR DG CHEST 2V
2 series · 2 of 2 positions shown · non-contrast
Comparison: 05/21/2020

CLINICAL DATA: Chest pain, 33 weeks pregnant

EXAM:
CHEST - 2 VIEW

[chest pa]
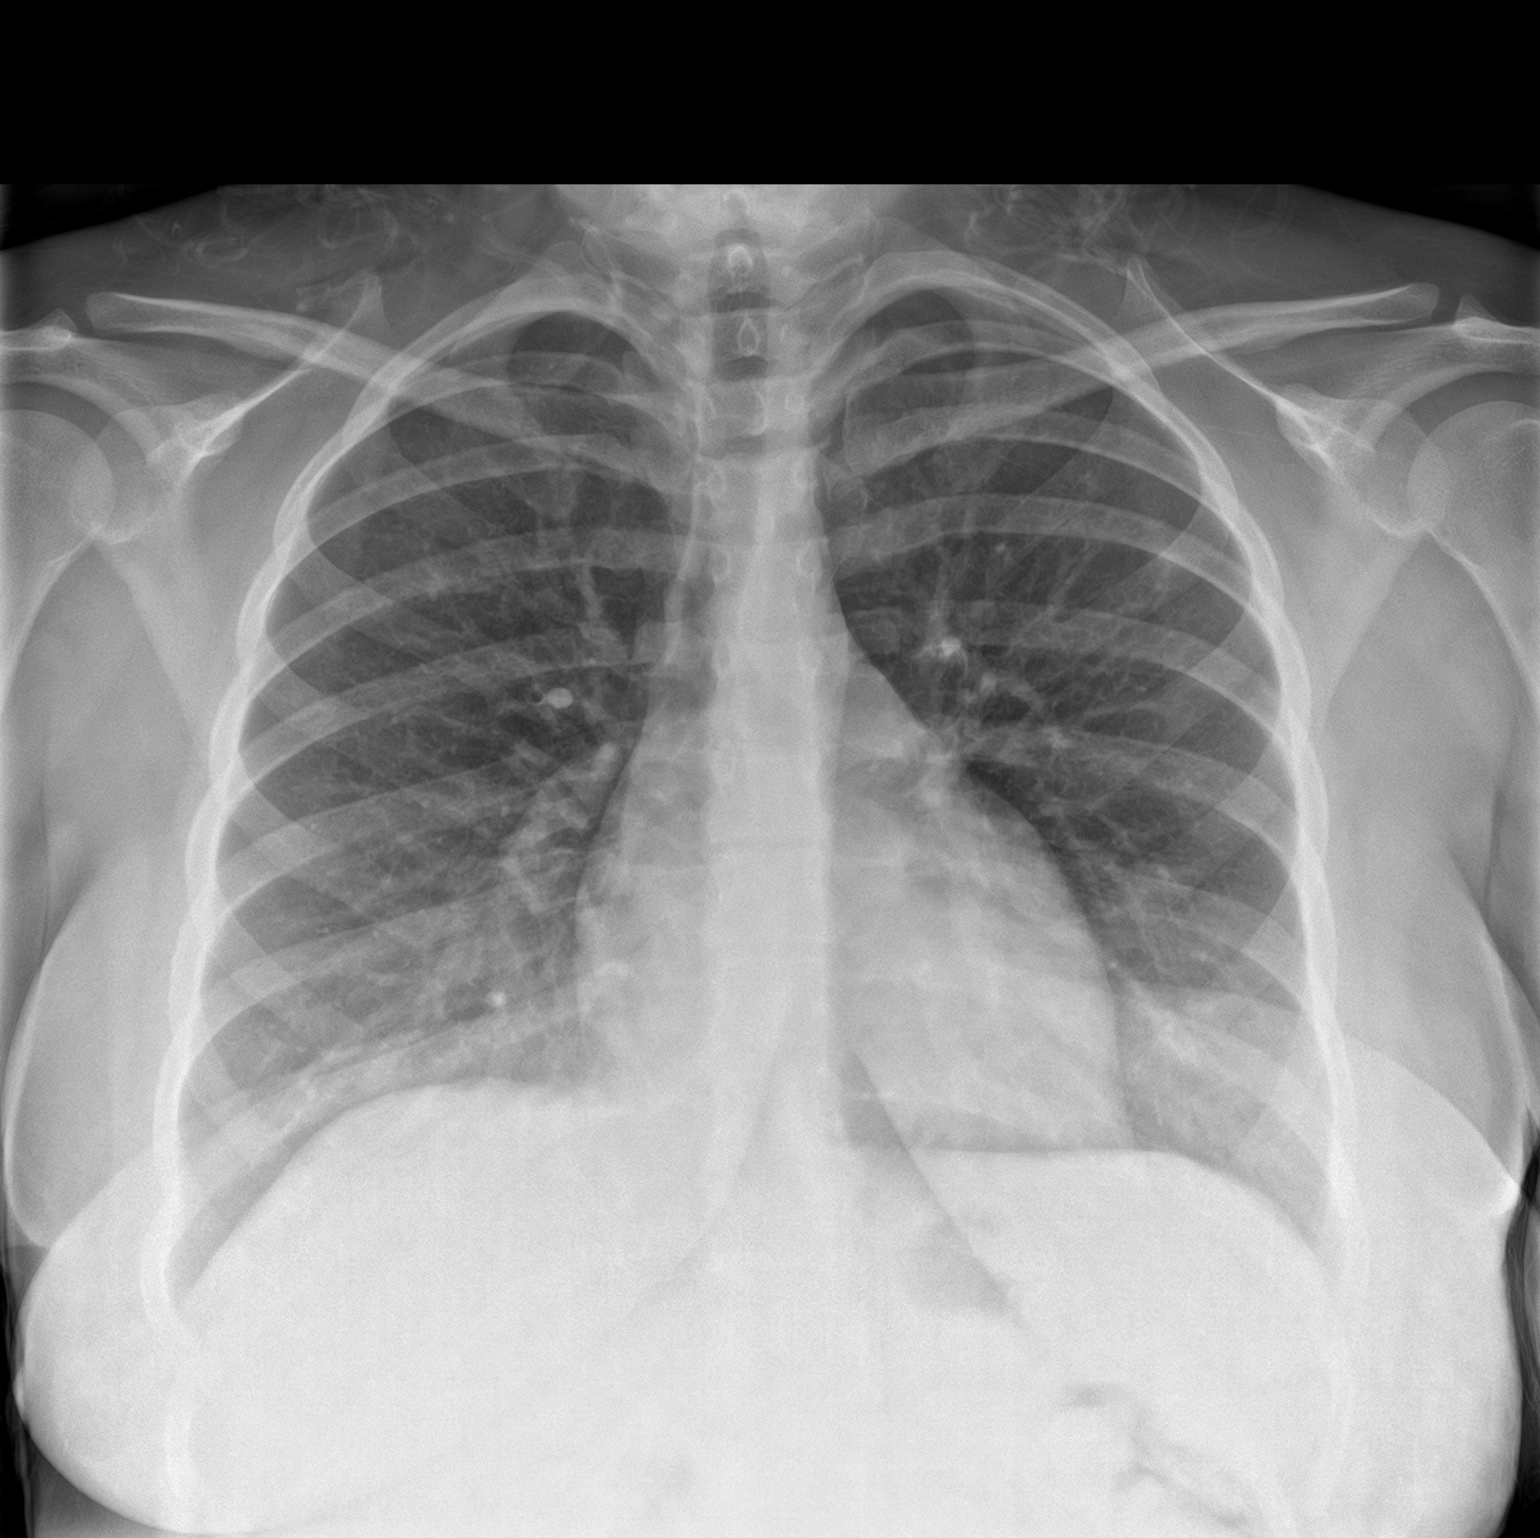

[chest lat]
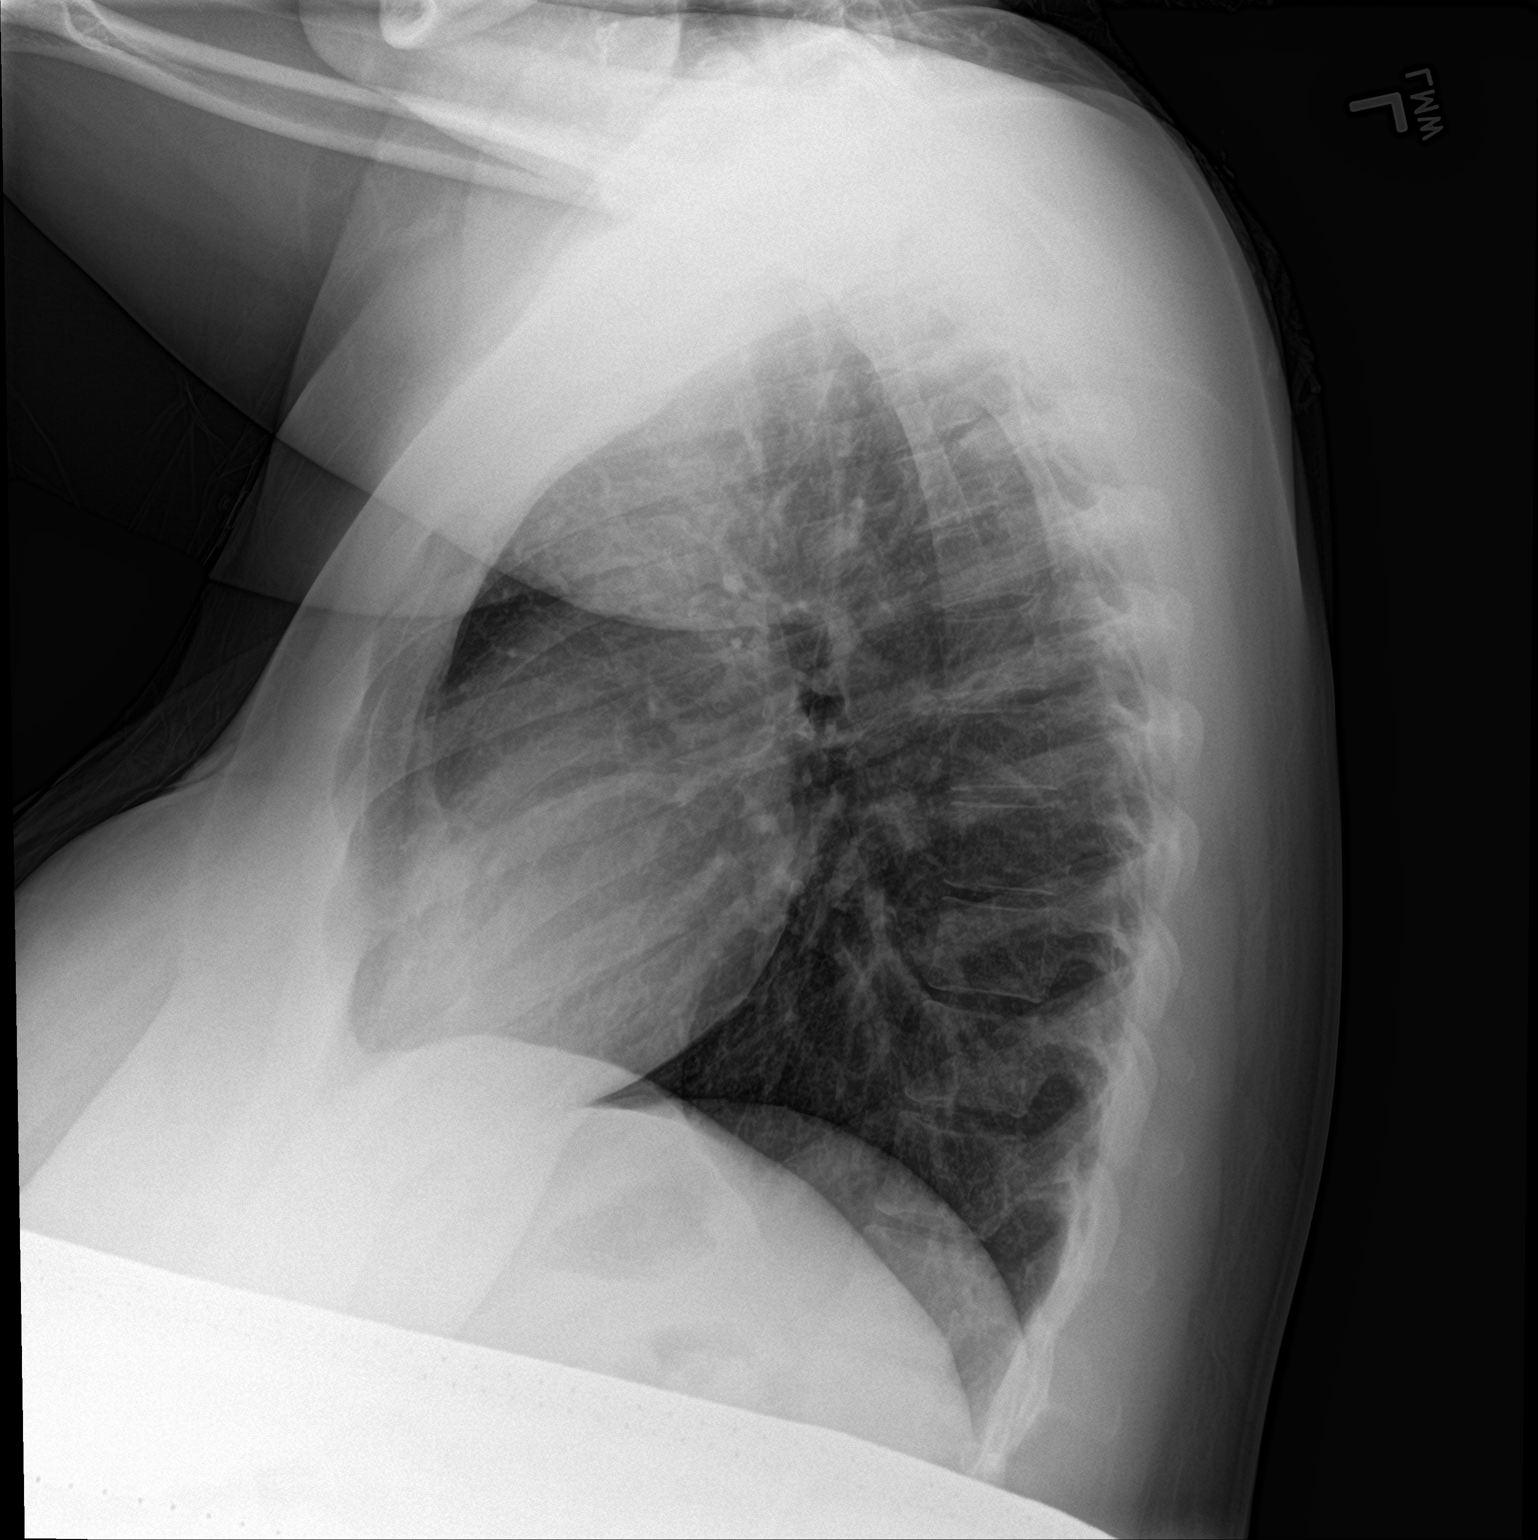

[2 of 2 positions shown; findings below may reference images not displayed]

FINDINGS: Lungs are clear.  No pleural effusion or pneumothorax.

The heart is normal in size.

Visualized osseous structures are within normal limits.
IMPRESSION: Normal chest radiographs.

## 2024-02-13 IMAGING — US US EXTREM LOW VENOUS
1 series · 13 of 24 positions shown · non-contrast
Comparison: None Available.

CLINICAL DATA: Chest pain and shortness of breath, 33 weeks
pregnant with leg pain, initial encounter



[Series 1: us venous img lower bilat (dvt) · portal-venous · 13 of 58 slices shown]
[im 1/58]
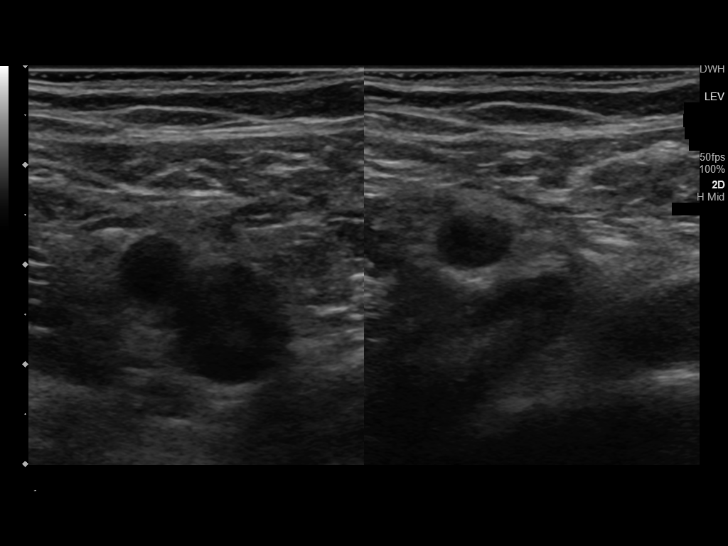
[im 5/58]
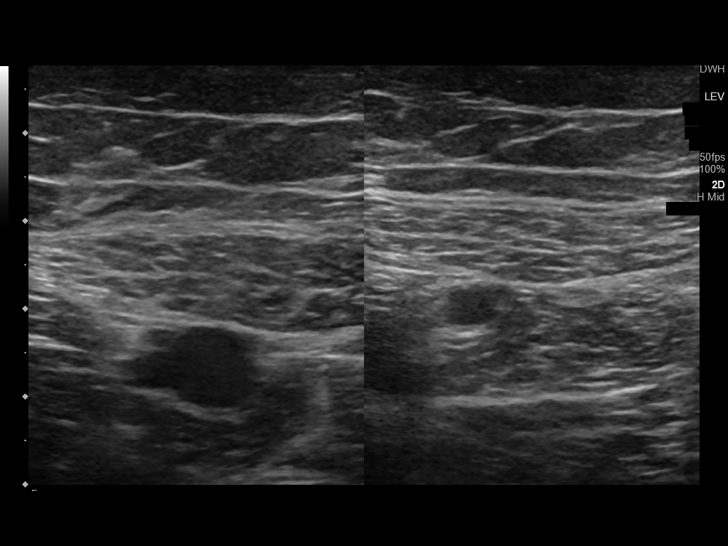
[im 10/58]
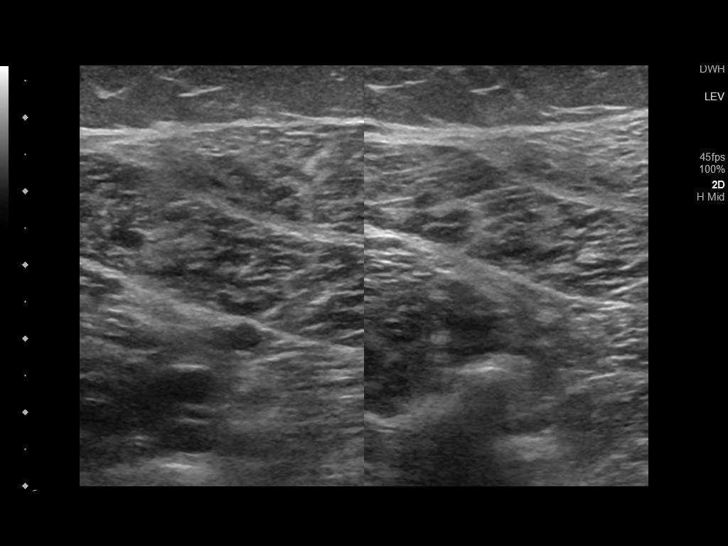
[im 15/58]
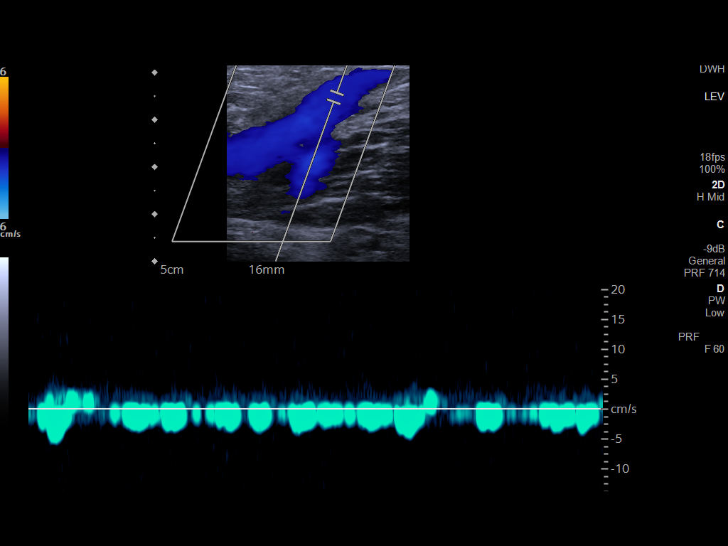
[im 20/58]
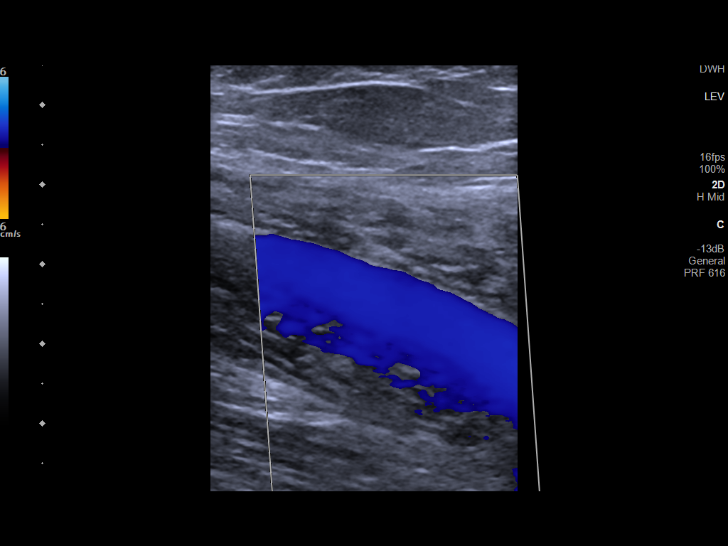
[im 25/58]
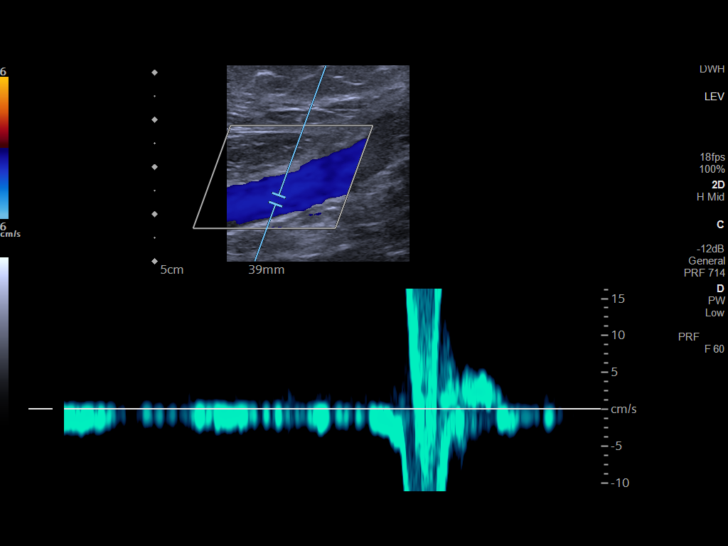
[im 30/58]
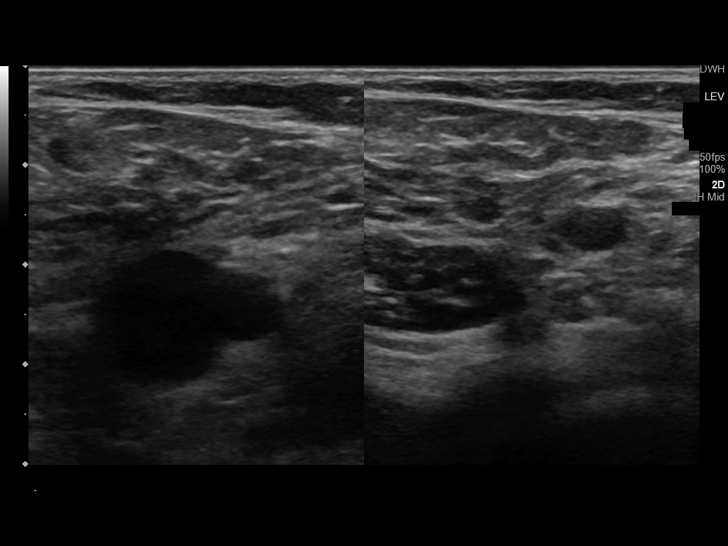
[im 33/58]
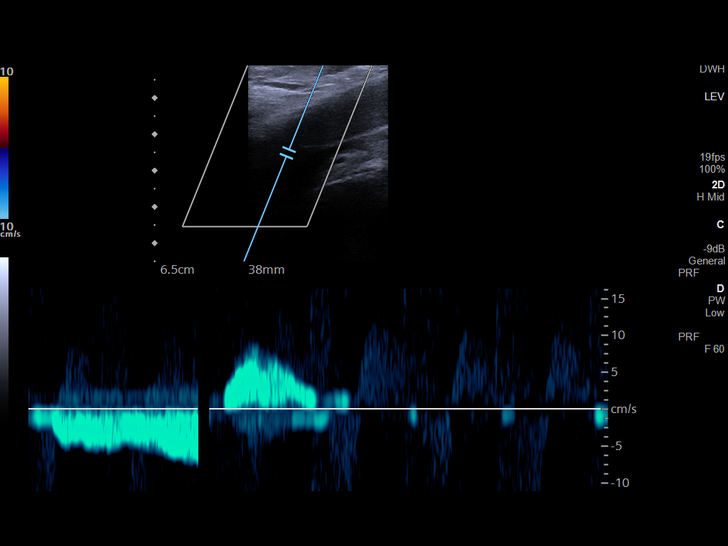
[im 38/58]
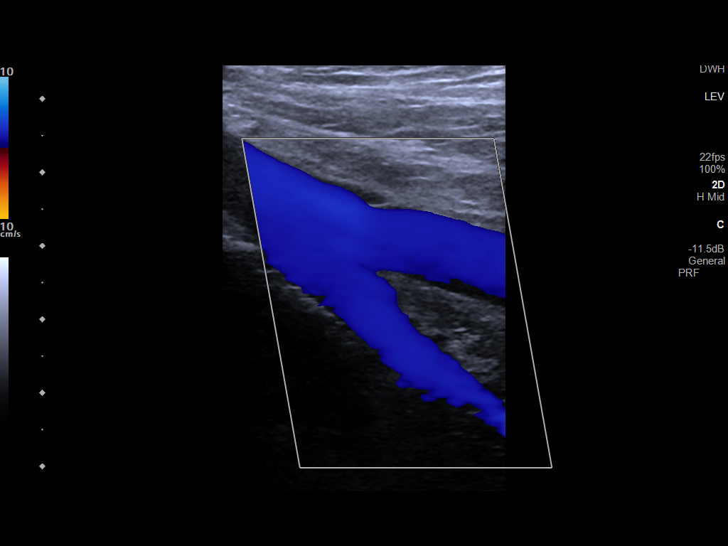
[im 43/58]
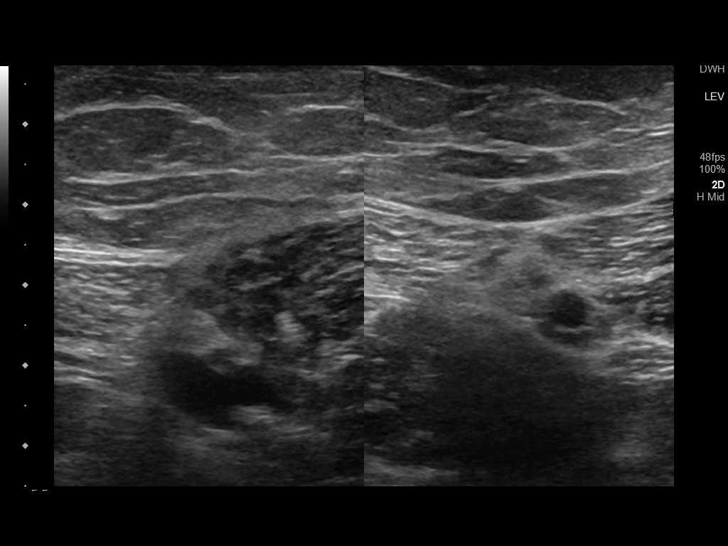
[im 48/58]
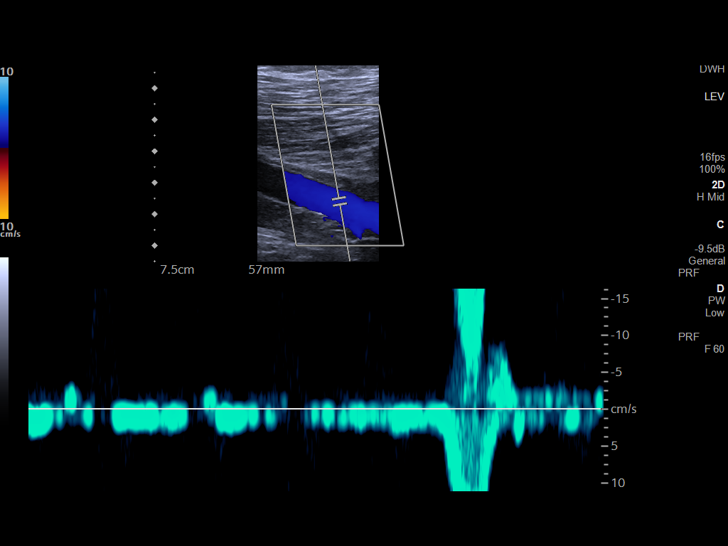
[im 53/58]
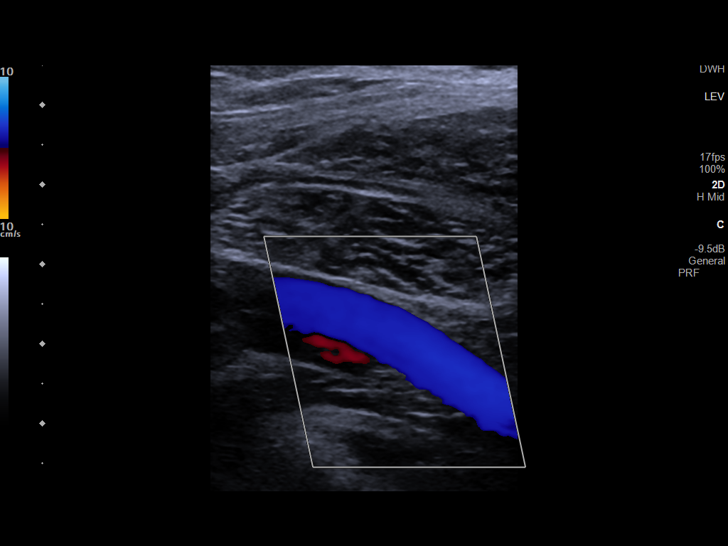
[im 58/58]
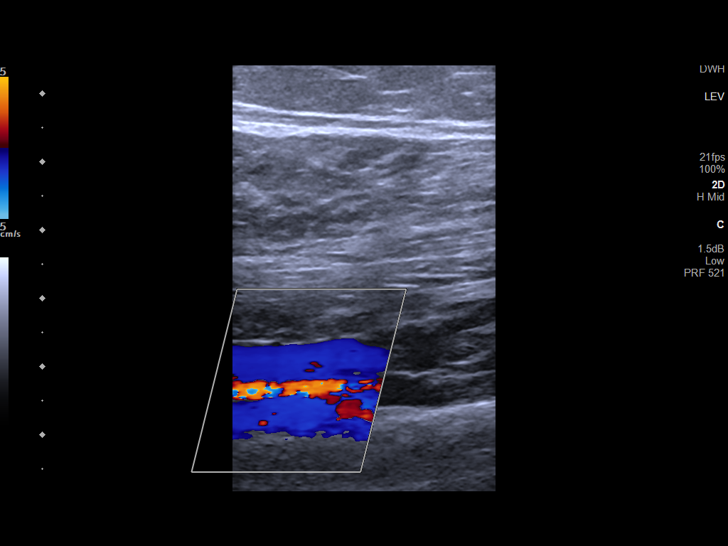

[13 of 24 positions shown; findings below may reference images not displayed]

FINDINGS: RIGHT LOWER EXTREMITY

Common Femoral Vein: No evidence of thrombus. Normal
compressibility, respiratory phasicity and response to augmentation.

Saphenofemoral Junction: No evidence of thrombus. Normal
compressibility and flow on color Doppler imaging.

Profunda Femoral Vein: No evidence of thrombus. Normal
compressibility and flow on color Doppler imaging.

Femoral Vein: No evidence of thrombus. Normal compressibility,
respiratory phasicity and response to augmentation.

Popliteal Vein: No evidence of thrombus. Normal compressibility,
respiratory phasicity and response to augmentation.

Calf Veins: No evidence of thrombus. Normal compressibility and flow
on color Doppler imaging.

Superficial Great Saphenous Vein: No evidence of thrombus. Normal
compressibility.

Venous Reflux:  None.

Other Findings:  None.

LEFT LOWER EXTREMITY

Common Femoral Vein: No evidence of thrombus. Normal
compressibility, respiratory phasicity and response to augmentation.

Saphenofemoral Junction: No evidence of thrombus. Normal
compressibility and flow on color Doppler imaging.

Profunda Femoral Vein: No evidence of thrombus. Normal
compressibility and flow on color Doppler imaging.

Femoral Vein: No evidence of thrombus. Normal compressibility,
respiratory phasicity and response to augmentation.

Popliteal Vein: No evidence of thrombus. Normal compressibility,
respiratory phasicity and response to augmentation.

Calf Veins: No evidence of thrombus. Normal compressibility and flow
on color Doppler imaging.

Superficial Great Saphenous Vein: No evidence of thrombus. Normal
compressibility.

Venous Reflux:  None.

Other Findings:  None.
IMPRESSION: No evidence of deep venous thrombosis in either lower extremity.
